# Patient Record
Sex: Male | Born: 1960
Health system: Southern US, Community
[De-identification: ages and names within clinical notes are randomized; demographics above are authoritative.]

## PROBLEM LIST (undated history)

## (undated) DIAGNOSIS — H16209 Unspecified keratoconjunctivitis, unspecified eye: Secondary | ICD-10-CM

## (undated) DIAGNOSIS — D869 Sarcoidosis, unspecified: Secondary | ICD-10-CM

## (undated) DIAGNOSIS — E785 Hyperlipidemia, unspecified: Secondary | ICD-10-CM

## (undated) DIAGNOSIS — K5790 Diverticulosis of intestine, part unspecified, without perforation or abscess without bleeding: Secondary | ICD-10-CM

## (undated) DIAGNOSIS — F419 Anxiety disorder, unspecified: Secondary | ICD-10-CM

## (undated) HISTORY — PX: POLYPECTOMY: SHX149

## (undated) HISTORY — DX: Unspecified keratoconjunctivitis, unspecified eye: H16.209

## (undated) HISTORY — DX: Diverticulosis of intestine, part unspecified, without perforation or abscess without bleeding: K57.90

## (undated) HISTORY — PX: WRIST FRACTURE SURGERY: SHX121

## (undated) HISTORY — DX: Hyperlipidemia, unspecified: E78.5

## (undated) HISTORY — DX: Sarcoidosis, unspecified: D86.9

## (undated) HISTORY — DX: Anxiety disorder, unspecified: F41.9

## (undated) HISTORY — PX: APPENDECTOMY: SHX54

## (undated) HISTORY — PX: INGUINAL HERNIA REPAIR: SUR1180

## (undated) HISTORY — PX: OTHER SURGICAL HISTORY: SHX169

---

## 1983-07-12 DIAGNOSIS — D869 Sarcoidosis, unspecified: Secondary | ICD-10-CM

## 1983-07-12 HISTORY — DX: Sarcoidosis, unspecified: D86.9

## 2004-06-17 ENCOUNTER — Ambulatory Visit: Payer: Self-pay | Admitting: Internal Medicine

## 2005-05-19 ENCOUNTER — Ambulatory Visit: Payer: Self-pay | Admitting: Internal Medicine

## 2006-07-10 ENCOUNTER — Ambulatory Visit: Payer: Self-pay | Admitting: Internal Medicine

## 2006-07-10 LAB — CONVERTED CEMR LAB
Albumin: 4.2 g/dL (ref 3.5–5.2)
Alkaline Phosphatase: 88 units/L (ref 39–117)
Basophils Absolute: 0 10*3/uL (ref 0.0–0.1)
CO2: 29 meq/L (ref 19–32)
Calcium: 9.1 mg/dL (ref 8.4–10.5)
Chol/HDL Ratio, serum: 3.9
Glomerular Filtration Rate, Af Am: 103 mL/min/{1.73_m2}
Glucose, Bld: 98 mg/dL (ref 70–99)
LDL DIRECT: 132.2 mg/dL
Lymphocytes Relative: 17.9 % (ref 12.0–46.0)
Monocytes Absolute: 0.4 10*3/uL (ref 0.2–0.7)
Monocytes Relative: 7.9 % (ref 3.0–11.0)
Platelets: 277 10*3/uL (ref 150–400)
Potassium: 3.8 meq/L (ref 3.5–5.1)
TSH: 3.6 microintl units/mL (ref 0.35–5.50)
Total Protein: 7 g/dL (ref 6.0–8.3)
Triglyceride fasting, serum: 68 mg/dL (ref 0–149)
VLDL: 14 mg/dL (ref 0–40)

## 2006-11-06 ENCOUNTER — Ambulatory Visit: Payer: Self-pay | Admitting: Internal Medicine

## 2006-11-06 LAB — CONVERTED CEMR LAB
ALT: 21 units/L (ref 0–40)
AST: 24 units/L (ref 0–37)
LDL Cholesterol: 98 mg/dL (ref 0–99)
Total CHOL/HDL Ratio: 3.5
VLDL: 14 mg/dL (ref 0–40)

## 2006-12-25 ENCOUNTER — Ambulatory Visit: Payer: Self-pay | Admitting: Internal Medicine

## 2006-12-25 LAB — CONVERTED CEMR LAB
Nitrite: NEGATIVE
Specific Gravity, Urine: 1.01
WBC Urine, dipstick: NEGATIVE

## 2007-05-24 ENCOUNTER — Ambulatory Visit: Payer: Self-pay | Admitting: Internal Medicine

## 2007-06-01 LAB — CONVERTED CEMR LAB
ALT: 24 units/L (ref 0–53)
VLDL: 9 mg/dL (ref 0–40)

## 2007-11-15 ENCOUNTER — Ambulatory Visit: Payer: Self-pay | Admitting: Internal Medicine

## 2007-11-18 LAB — CONVERTED CEMR LAB
AST: 26 units/L (ref 0–37)
Albumin: 3.9 g/dL (ref 3.5–5.2)
Alkaline Phosphatase: 81 units/L (ref 39–117)
Cholesterol: 154 mg/dL (ref 0–200)
Total CHOL/HDL Ratio: 3
Total Protein: 6.6 g/dL (ref 6.0–8.3)
Triglycerides: 49 mg/dL (ref 0–149)

## 2007-11-20 ENCOUNTER — Telehealth (INDEPENDENT_AMBULATORY_CARE_PROVIDER_SITE_OTHER): Payer: Self-pay | Admitting: *Deleted

## 2007-11-26 ENCOUNTER — Ambulatory Visit: Payer: Self-pay | Admitting: Internal Medicine

## 2007-11-26 DIAGNOSIS — D869 Sarcoidosis, unspecified: Secondary | ICD-10-CM | POA: Insufficient documentation

## 2009-01-23 ENCOUNTER — Ambulatory Visit: Payer: Self-pay | Admitting: Internal Medicine

## 2009-01-23 DIAGNOSIS — E785 Hyperlipidemia, unspecified: Secondary | ICD-10-CM | POA: Insufficient documentation

## 2009-01-26 ENCOUNTER — Encounter (INDEPENDENT_AMBULATORY_CARE_PROVIDER_SITE_OTHER): Payer: Self-pay | Admitting: *Deleted

## 2009-02-02 ENCOUNTER — Encounter (INDEPENDENT_AMBULATORY_CARE_PROVIDER_SITE_OTHER): Payer: Self-pay | Admitting: *Deleted

## 2009-02-12 ENCOUNTER — Ambulatory Visit: Payer: Self-pay | Admitting: Internal Medicine

## 2009-02-13 ENCOUNTER — Encounter (INDEPENDENT_AMBULATORY_CARE_PROVIDER_SITE_OTHER): Payer: Self-pay | Admitting: *Deleted

## 2009-02-13 ENCOUNTER — Ambulatory Visit: Payer: Self-pay | Admitting: Gastroenterology

## 2009-03-11 HISTORY — PX: COLONOSCOPY W/ POLYPECTOMY: SHX1380

## 2009-03-13 ENCOUNTER — Encounter: Payer: Self-pay | Admitting: Gastroenterology

## 2009-03-13 ENCOUNTER — Ambulatory Visit: Payer: Self-pay | Admitting: Gastroenterology

## 2009-03-18 ENCOUNTER — Ambulatory Visit: Payer: Self-pay | Admitting: Internal Medicine

## 2009-03-18 DIAGNOSIS — Z86010 Personal history of colonic polyps: Secondary | ICD-10-CM | POA: Insufficient documentation

## 2009-03-18 DIAGNOSIS — K5730 Diverticulosis of large intestine without perforation or abscess without bleeding: Secondary | ICD-10-CM | POA: Insufficient documentation

## 2009-03-18 LAB — CONVERTED CEMR LAB
Bilirubin Urine: NEGATIVE
Blood in Urine, dipstick: NEGATIVE
Glucose, Urine, Semiquant: NEGATIVE
Protein, U semiquant: NEGATIVE
Specific Gravity, Urine: 1.01
pH: 6.5

## 2009-03-19 ENCOUNTER — Encounter: Payer: Self-pay | Admitting: Gastroenterology

## 2009-09-10 ENCOUNTER — Ambulatory Visit: Payer: Self-pay | Admitting: Internal Medicine

## 2009-09-10 DIAGNOSIS — H9319 Tinnitus, unspecified ear: Secondary | ICD-10-CM | POA: Insufficient documentation

## 2010-02-02 ENCOUNTER — Ambulatory Visit: Payer: Self-pay | Admitting: Internal Medicine

## 2010-08-08 LAB — CONVERTED CEMR LAB
ALT: 30 units/L (ref 0–53)
AST: 25 units/L (ref 0–37)
AST: 31 units/L (ref 0–37)
Albumin: 4.3 g/dL (ref 3.5–5.2)
Alkaline Phosphatase: 77 units/L (ref 39–117)
Alkaline Phosphatase: 80 units/L (ref 39–117)
Basophils Absolute: 0 10*3/uL (ref 0.0–0.1)
Basophils Absolute: 0 10*3/uL (ref 0.0–0.1)
Bilirubin, Direct: 0.1 mg/dL (ref 0.0–0.3)
Calcium: 9.2 mg/dL (ref 8.4–10.5)
Calcium: 9.3 mg/dL (ref 8.4–10.5)
Creatinine, Ser: 0.9 mg/dL (ref 0.4–1.5)
Eosinophils Absolute: 0.1 10*3/uL (ref 0.0–0.7)
Eosinophils Relative: 0.8 % (ref 0.0–5.0)
GFR calc non Af Amer: 105.22 mL/min (ref >60)
GFR calc non Af Amer: 94.9 mL/min (ref >60)
Glucose, Bld: 100 mg/dL — ABNORMAL HIGH (ref 70–99)
Glucose, Bld: 105 mg/dL — ABNORMAL HIGH (ref 70–99)
HCT: 44.7 % (ref 39.0–52.0)
HDL: 47.4 mg/dL (ref >39.00)
HDL: 65.2 mg/dL (ref >39.00)
Hemoglobin: 15 g/dL (ref 13.0–17.0)
Hemoglobin: 15.4 g/dL (ref 13.0–17.0)
Hgb A1c MFr Bld: 4.9 % (ref 4.6–6.5)
LDL Cholesterol: 100 mg/dL — ABNORMAL HIGH (ref 0–99)
LDL Goal: 130 mg/dL
Lymphocytes Relative: 19.1 % (ref 12.0–46.0)
Lymphocytes Relative: 23.2 % (ref 12.0–46.0)
Monocytes Relative: 7.7 % (ref 3.0–12.0)
Monocytes Relative: 9.7 % (ref 3.0–12.0)
Neutro Abs: 2.6 10*3/uL (ref 1.4–7.7)
Neutro Abs: 3.7 10*3/uL (ref 1.4–7.7)
Neutrophils Relative %: 64.4 % (ref 43.0–77.0)
Platelets: 217 10*3/uL (ref 150.0–400.0)
Potassium: 4.5 meq/L (ref 3.5–5.1)
RBC: 4.64 M/uL (ref 4.22–5.81)
RDW: 11.7 % (ref 11.5–14.6)
RDW: 12.5 % (ref 11.5–14.6)
Sodium: 139 meq/L (ref 135–145)
Sodium: 142 meq/L (ref 135–145)
Total Bilirubin: 1.1 mg/dL (ref 0.3–1.2)
Total CHOL/HDL Ratio: 3
Triglycerides: 59 mg/dL (ref 0.0–149.0)
VLDL: 11.8 mg/dL (ref 0.0–40.0)
VLDL: 4.8 mg/dL (ref 0.0–40.0)
WBC: 5.1 10*3/uL (ref 4.5–10.5)

## 2010-08-10 NOTE — Assessment & Plan Note (Signed)
Summary: CPX/KDC   Vital Signs:  Patient profile:   50 year old male Height:      75.25 inches Weight:      184.2 pounds BMI:     22.95 Temp:     98.3 degrees F oral Pulse rate:   64 / minute Resp:     14 per minute BP sitting:   130 / 84  (left arm) Cuff size:   large  Vitals Entered By: Shonna Chock CMA (February 02, 2010 9:51 AM)  CC: Lipid Management   CC:  Lipid Management.  History of Present Illness: [name] is here for a physical; he is asymptomatic. He is engaged in  a very high level CVE / weight program  6X/ week for 4 months  with 16  # loss. He stopped statin 2 months  ago.  Lipid Management History:      Positive NCEP/ATP III risk factors include male age 26 years old or older.  Negative NCEP/ATP III risk factors include non-diabetic, no family history for ischemic heart disease, non-tobacco-user status, non-hypertensive, no ASHD (atherosclerotic heart disease), no prior stroke/TIA, no peripheral vascular disease, and no history of aortic aneurysm.     Current Medications (verified): 1)  Mega Multi Men  Cr-Tabs (Multiple Vitamins-Minerals) .... Daily  Allergies (verified): No Known Drug Allergies  Past History:  Past Medical History: Keratoconjunctivitis sicca, PMH of  Hyperlipidemia: NMR 2005: 170(1705/1193), HDL 39, TG 85. LDL goal = < 130. Framingham Study LDL goal = < 160. Sarcoidosis, PMH of  1985 Colonic polyps, Diverticulosis   03/13/2009, Dr  Wendall Papa  Past Surgical History: Trans Bronchial Biopsy  in mid 1980s:  Sarcoidosis, S/P  oral  steroids > 1 year Appendectomy fractured left wrist, S/P surgery Inguinal herniorrhaphy bilaterally lumpectomy neck  @ age 12 Colon polypectomy 03/2009  Family History: mgf: MI in 69s mother: murmur, CAD ,4 vessel CBAG paternal grandfather :prostate cancer father: Alzheimers,  colon polyps brother: died HIV; 2 sons have had non MRSA  Staph skin  infections   Social History: Former Smoker: quit @  age  71 Alcohol use-yes 18 beers/week Occupation:Supervisor  Review of Systems  The patient denies anorexia, fever, vision loss, decreased hearing, hoarseness, chest pain, syncope, dyspnea on exertion, peripheral edema, prolonged cough, headaches, hemoptysis, abdominal pain, melena, hematochezia, severe indigestion/heartburn, hematuria, suspicious skin lesions, depression, unusual weight change, abnormal bleeding, enlarged lymph nodes, and angioedema.    Physical Exam  General:  Thin but well-developed,well-nourished; alert,appropriate and cooperative throughout examination Head:  Normocephalic and atraumatic without obvious abnormalities. No apparent alopecia  Eyes:  No corneal or conjunctival inflammation noted. Perrla. Funduscopic exam benign, without hemorrhages, exudates or papilledema.  Ears:  External ear exam shows no significant lesions or deformities.  Otoscopic examination reveals clear canals, tympanic membranes are intact bilaterally without bulging, retraction, inflammation or discharge. Hearing is grossly normal bilaterally. Nose:  External nasal examination shows no deformity or inflammation. Nasal mucosa are pink and moist without lesions or exudates. Slight septal deviation Mouth:  Oral mucosa and oropharynx without lesions or exudates.  Teeth in good repair. Neck:  No deformities, masses, or tenderness noted. Lungs:  Normal respiratory effort, chest expands symmetrically. Lungs are clear to auscultation, no crackles or wheezes. Heart:  Normal rate and regular rhythm. S1 and S2 normal without gallop, murmur, click, rub or other extra sounds. Abdomen:  Bowel sounds positive,abdomen soft and non-tender without masses, organomegaly or hernias noted. Rectal:  No external abnormalities noted. Normal sphincter tone.  No rectal masses or tenderness. Genitalia:  Testes bilaterally descended without nodularity, tenderness or masses. No scrotal masses or lesions. No penis lesions or urethral  discharge. Prostate:  Prostate gland firm and smooth,  ULN but no enlargement, nodularity, tenderness, mass, asymmetry or induration. Msk:  No deformity or scoliosis noted of thoracic or lumbar spine.   Pulses:  R and L carotid,radial,dorsalis pedis and posterior tibial pulses are full and equal bilaterally Extremities:  No clubbing, cyanosis, edema, or deformity noted with normal full range of motion of all joints.   Neurologic:  alert & oriented X3 and DTRs symmetrical and normal.   Skin:  Intact without suspicious lesions or rashes. Tanned Cervical Nodes:  No lymphadenopathy noted Axillary Nodes:  No palpable lymphadenopathy Inguinal Nodes:  No significant adenopathy Psych:  memory intact for recent and remote, normally interactive, and good eye contact.     Impression & Recommendations:  Problem # 1:  ROUTINE GENERAL MEDICAL EXAM@HEALTH  CARE FACL (ICD-V70.0)  Orders: EKG w/ Interpretation (93000) Venipuncture (04540) TLB-Lipid Panel (80061-LIPID) TLB-BMP (Basic Metabolic Panel-BMET) (80048-METABOL) TLB-CBC Platelet - w/Differential (85025-CBCD) TLB-Hepatic/Liver Function Pnl (80076-HEPATIC) TLB-TSH (Thyroid Stimulating Hormone) (84443-TSH) TLB-PSA (Prostate Specific Antigen) (84153-PSA)  Problem # 2:  HYPERLIPIDEMIA (ICD-272.4) on TLC  The following medications were removed from the medication list:    Pravachol 40 Mg Tabs (Pravastatin sodium) .Marland Kitchen... 1 by mouth qd  Problem # 3:  COLONIC POLYPS, HX OF (ICD-V12.72) as per Dr Christella Hartigan  Problem # 4:  FAMILY HISTORY OF MALIGNANT NEOPLASM PROSTATE (ICD-V16.42)  Orders: Venipuncture (98119)  Complete Medication List: 1)  Mega Multi Men Cr-tabs (Multiple vitamins-minerals) .... Daily  Lipid Assessment/Plan:      Based on NCEP/ATP III, the patient's risk factor category is "0-1 risk factors".  The patient's lipid goals are as follows: Total cholesterol goal is 200; LDL cholesterol goal is 130; HDL cholesterol goal is 40;  Triglyceride goal is 150.  His LDL cholesterol goal has been met.    Patient Instructions: 1)  Take an  81 mg coated Aspirin every day.  Appended Document: CPX/KDC

## 2010-08-10 NOTE — Assessment & Plan Note (Signed)
Summary: LEFT EAR PAIN & RINGING/RH........Marland Kitchen   Vital Signs:  Patient profile:   50 year old male Weight:      198.2 pounds Temp:     98.3 degrees F oral Pulse rate:   72 / minute Resp:     14 per minute BP sitting:   100 / 66  (left arm) Cuff size:   large  Vitals Entered By: Shonna Chock (September 10, 2009 12:23 PM) CC: Left ear concerns x 3-4 weeks Comments REVIEWED MED LIST, PATIENT AGREED DOSE AND INSTRUCTION CORRECT    CC:  Left ear concerns x 3-4 weeks.  History of Present Illness: Sharp pain R ear which woke him X2  three weeks ago. That resolved over 24 hrs. Tinnitus L ear 1 week later . No PMH of ear disease. Dental extraction L mandible last week. Rx: none  Allergies (verified): No Known Drug Allergies  Review of Systems General:  Denies chills, fever, sweats, and weight loss. ENT:  Denies decreased hearing, ear discharge, nasal congestion, sinus pressure, and sore throat; No frontal headache, facial pain or purulence. Neuro:  Denies disturbances in coordination, headaches, poor balance, and sensation of room spinning.  Physical Exam  General:  well-nourished,in no acute distress; alert,appropriate and cooperative throughout examination Eyes:  No corneal or conjunctival inflammation noted. EOMI. Perrla. No nystagmus. Ears:  External ear exam shows no significant lesions or deformities.  Otoscopic examination reveals clear canals, tympanic membranes are intact bilaterally without bulging, retraction, inflammation or discharge. Hearing is grossly normal bilaterally. Whisper heard @ 6 ftTuning fork exam normal Nose:  External nasal examination shows no deformity or inflammation. Nasal mucosa are pink and moist without lesions or exudates. Mouth:  Oral mucosa and oropharynx without lesions or exudates.  Teeth in good repair. Neurologic:  alert & oriented X3, finger-to-nose normal, and Romberg negative.   Cervical Nodes:  No lymphadenopathy noted Axillary Nodes:  No palpable  lymphadenopathy   Impression & Recommendations:  Problem # 1:  TINNITUS, LEFT (ICD-388.30)  Complete Medication List: 1)  Pravachol 40 Mg Tabs (Pravastatin sodium) .Marland Kitchen.. 1 by mouth qd  Patient Instructions: 1)  White noise @ night . Go to Web MD  re: tinnitus

## 2010-09-16 ENCOUNTER — Encounter: Payer: Self-pay | Admitting: Internal Medicine

## 2011-04-09 IMAGING — CR DG CHEST 2V
2 series · 2 of 2 positions shown · non-contrast
Comparison: Chest x-ray of 07/02/2001

CLINICAL DATA: Sarcoidosis, wellness exam

CHEST - 2 VIEW

[view not recorded (1 of 2)]
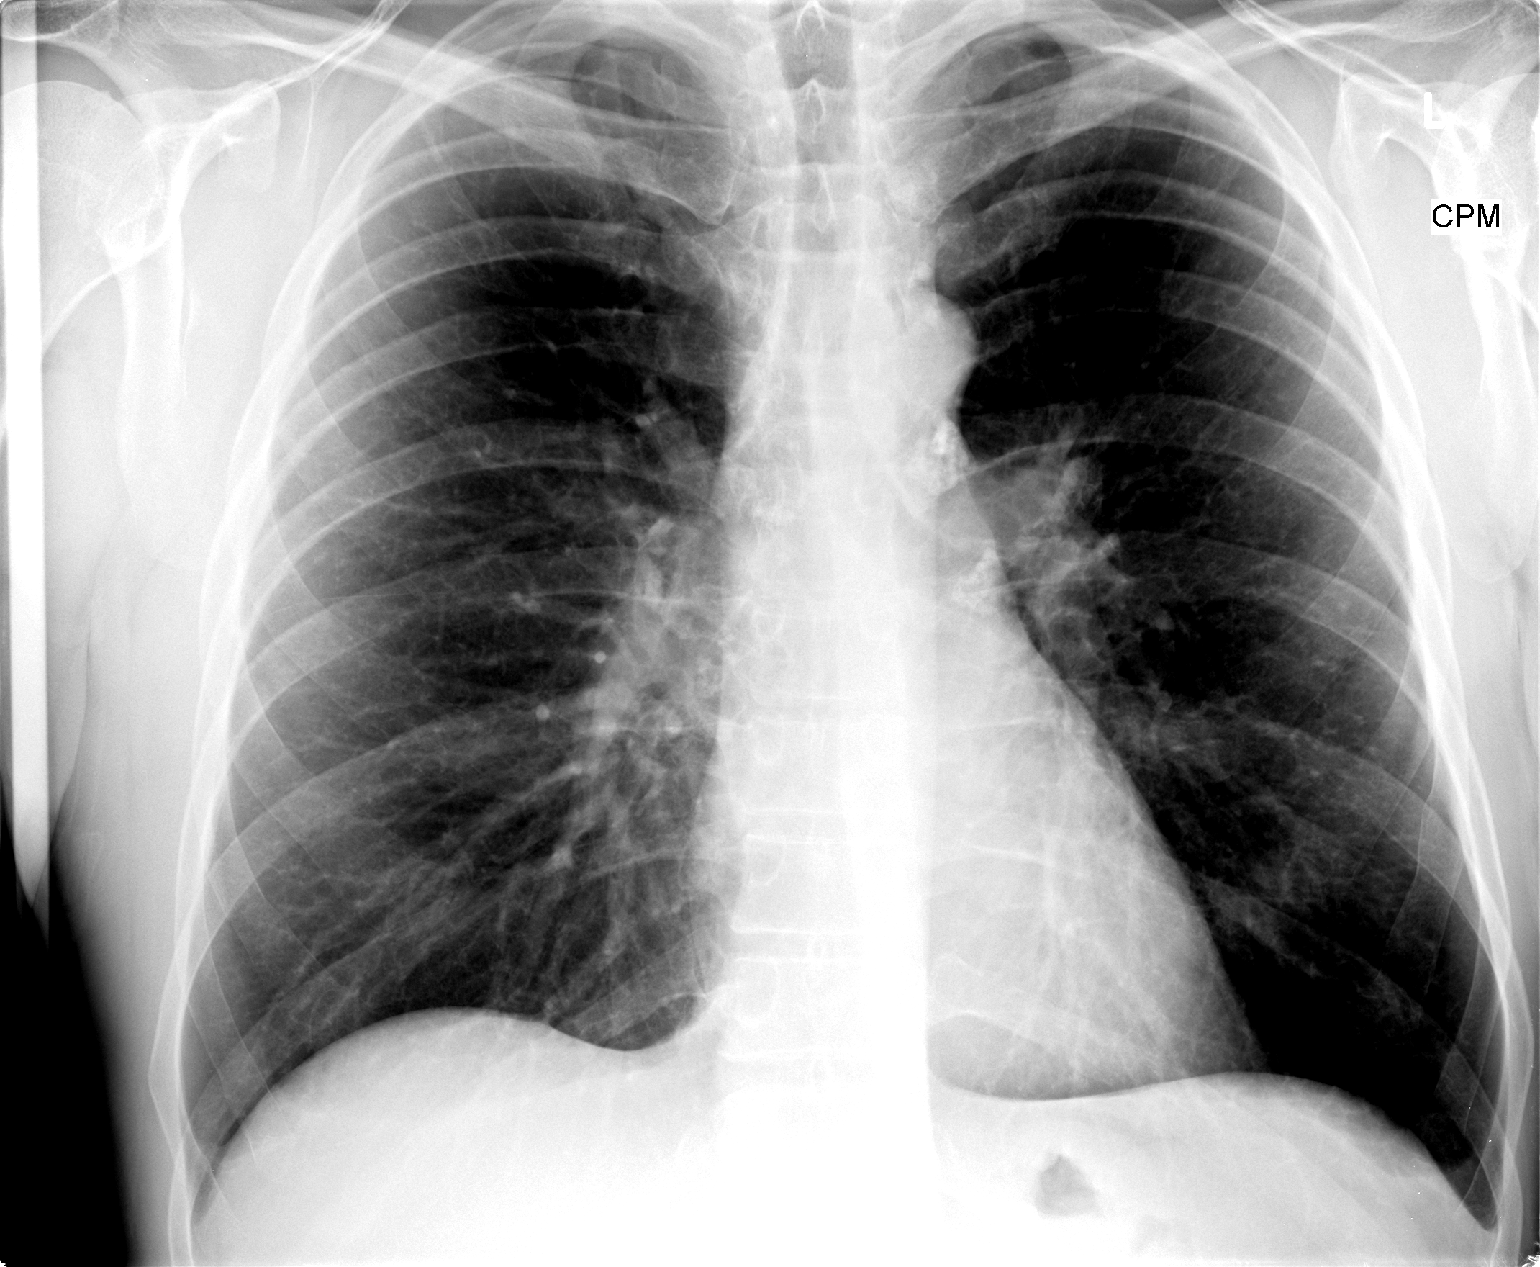

[view not recorded (2 of 2)]
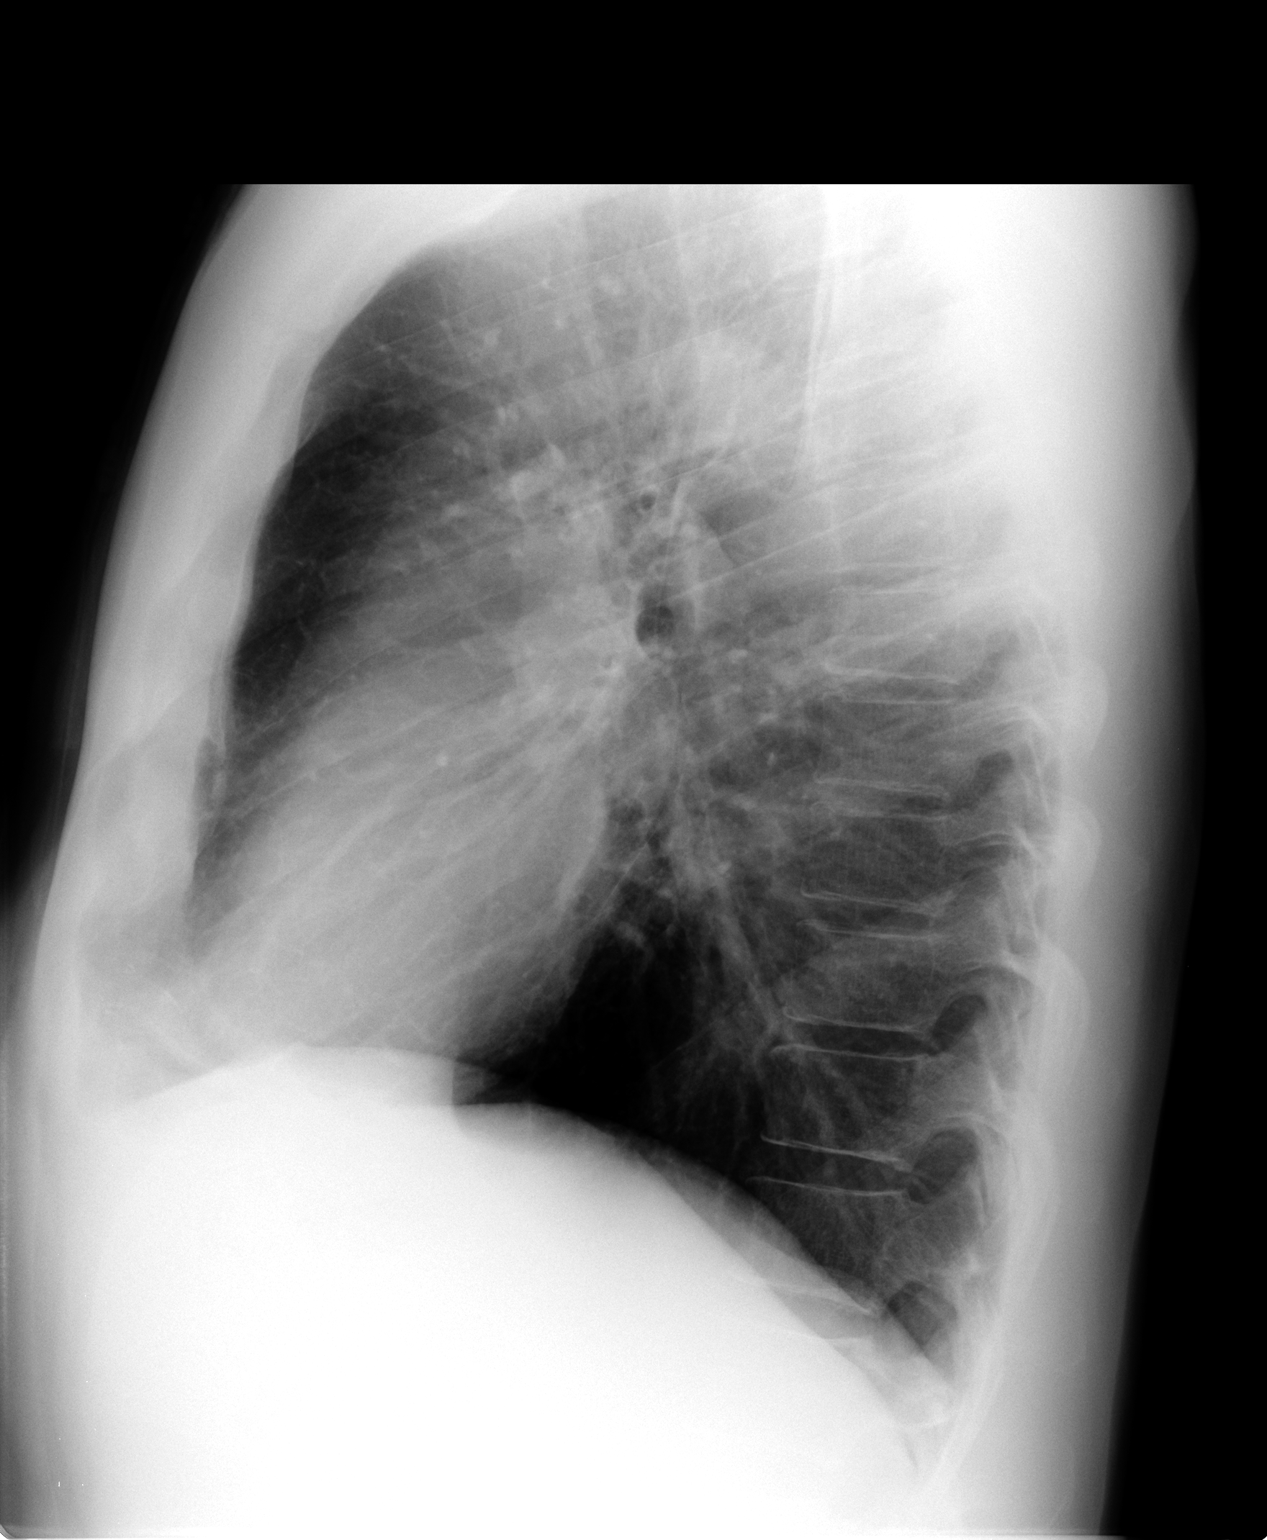

[2 of 2 positions shown; findings below may reference images not displayed]

FINDINGS: The lungs remain clear and hyperaerated.  Peribronchial
thickening is noted consistent with bronchitis.  Calcified
mediastinal nodes remain consistent with prior granulomatous
disease.  The heart is within normal limits in size.  No bony
abnormality is seen.
IMPRESSION: No change in hyperaeration and probable chronic bronchitis.  No
active lung disease.

## 2011-04-11 ENCOUNTER — Encounter: Payer: Self-pay | Admitting: Internal Medicine

## 2011-04-11 ENCOUNTER — Ambulatory Visit (INDEPENDENT_AMBULATORY_CARE_PROVIDER_SITE_OTHER): Payer: 59 | Admitting: Internal Medicine

## 2011-04-11 VITALS — BP 107/90 | HR 62 | Temp 97.7°F | Resp 14 | Ht 75.0 in | Wt 185.0 lb

## 2011-04-11 DIAGNOSIS — Z0000 Encounter for general adult medical examination without abnormal findings: Secondary | ICD-10-CM

## 2011-04-11 DIAGNOSIS — Z86010 Personal history of colonic polyps: Secondary | ICD-10-CM

## 2011-04-11 DIAGNOSIS — E785 Hyperlipidemia, unspecified: Secondary | ICD-10-CM

## 2011-04-11 LAB — CBC WITH DIFFERENTIAL/PLATELET
Eosinophils Relative: 2.6 % (ref 0.0–5.0)
HCT: 44.4 % (ref 39.0–52.0)
Lymphs Abs: 1.3 10*3/uL (ref 0.7–4.0)
MCHC: 33.9 g/dL (ref 30.0–36.0)
MCV: 95.8 fl (ref 78.0–100.0)
Monocytes Absolute: 0.4 10*3/uL (ref 0.1–1.0)
Platelets: 263 10*3/uL (ref 150.0–400.0)
RDW: 12.7 % (ref 11.5–14.6)
WBC: 4.1 10*3/uL — ABNORMAL LOW (ref 4.5–10.5)

## 2011-04-11 NOTE — Patient Instructions (Signed)
Preventive Health Care: Exercise at least 30-45 minutes a day,  3-4 days a week.  Eat a low-fat diet with lots of fruits and vegetables, up to 7-9 servings per day. Consume less than 40 grams of sugar per day from foods & drinks with High Fructose Corn Sugar as # 1,2,3 or # 4 on label. Health Care Power of Attorney & Living Will. Complete if not in place ; these place you in charge of your health care decisions. 

## 2011-04-11 NOTE — Progress Notes (Signed)
Subjective:    Patient ID: Aaron Cherry, male    DOB: 03-13-1961, 50 y.o.   MRN: [id number]  HPI  Mr. Perkins  is here for a physical;acute issues include chronic R shoulder pain.      Review of Systems SHOULDER PAIN: Location: R   Onset: 2/12   Severity: up to 8 Pain is described as: variable , sharp to throbbing; constant pain  Worse with: lifting overhead    Better with: NSAIDS  Pain radiates to: not now; initially ito neck & jaw   Impaired range of motion: no  History of repetitive motion:  yes, lifting   History of trauma:  No other trauma   Past history of similar problem:  yes, 20 years ago post fall  Symptoms Back Pain:  no  Numbness/tingling:  Not now ; initially @ base of neck Weakness:  no  Red Flags Fever:  no  Bowel/bladder dysfunction:  no       Objective:   Physical Exam Gen.: Healthy and well-nourished in appearance. Alert, appropriate and cooperative throughout exam. Head: Normocephalic without obvious abnormalities;  no alopecia  Eyes: No corneal or conjunctival inflammation noted. Pupils equal round reactive to light and accommodation. Fundal exam is benign without hemorrhages, exudate, papilledema. Extraocular motion intact. Vision grossly normal. Ears: External  ear exam reveals no significant lesions or deformities. Canals clear .TMs normal. Hearing is grossly normal bilaterally; minimally decreased to whisper on L. Nose: External nasal exam reveals no deformity or inflammation. Nasal mucosa are pink and moist. No lesions or exudates noted.   Mouth: Oral mucosa and oropharynx reveal no lesions or exudates. Teeth in good repair. Neck: No deformities, masses, or tenderness noted. Range of motion &. Thyroid  normal. Lungs: Normal respiratory effort; chest expands symmetrically. Lungs are clear to auscultation without rales, wheezes, or increased work of breathing. Heart: Normal rate and rhythm. Normal S1 and S2. No gallop, click, or rub. S4 w/o   murmur. Abdomen: Bowel sounds normal; abdomen soft and nontender. No masses, organomegaly or hernias noted. Genitalia/ DRE: Varicoele on L; R prostate 1.5 + enlargement  .                                                                                   Musculoskeletal/extremities: No deformity or scoliosis noted of  the thoracic or lumbar spine. No clubbing, cyanosis, edema, or deformity noted. Range of motion  Normal, but slight pain with throwing motion .Tone & strength  normal.Joints normal. Nail health  good. Vascular: Carotid, radial artery, dorsalis pedis and  posterior tibial pulses are full and equal. No bruits present. Neurologic: Alert and oriented x3. Deep tendon reflexes symmetrical and normal.          Skin: Intact without suspicious lesions or rashes. Lymph: No cervical, axillary, or inguinal lymphadenopathy present. Psych: Mood and affect are normal. Normally interactive  Assessment & Plan:  #1 comprehensive physical exam; no acute findings #2 see Problem List with Assessments & Recommendations #3 R shoulder syndrome Plan: see Orders   Note: EKG is normal. Minor early repolarization changes are present which are stable and of no clinical significance.

## 2011-04-12 LAB — BASIC METABOLIC PANEL
BUN: 15 mg/dL (ref 6–23)
CO2: 26 mEq/L (ref 19–32)
Chloride: 105 mEq/L (ref 96–112)
Glucose, Bld: 93 mg/dL (ref 70–99)
Potassium: 4.1 mEq/L (ref 3.5–5.1)

## 2011-04-12 LAB — HEPATIC FUNCTION PANEL
ALT: 29 U/L (ref 0–53)
Bilirubin, Direct: 0.1 mg/dL (ref 0.0–0.3)
Total Bilirubin: 1.1 mg/dL (ref 0.3–1.2)

## 2011-04-12 LAB — LIPID PANEL
Cholesterol: 194 mg/dL (ref 0–200)
LDL Cholesterol: 113 mg/dL — ABNORMAL HIGH (ref 0–99)

## 2012-05-03 ENCOUNTER — Encounter: Payer: Self-pay | Admitting: Internal Medicine

## 2012-05-03 ENCOUNTER — Ambulatory Visit (INDEPENDENT_AMBULATORY_CARE_PROVIDER_SITE_OTHER): Payer: 59 | Admitting: Internal Medicine

## 2012-05-03 VITALS — BP 124/88 | HR 61 | Temp 97.7°F | Resp 14 | Ht 75.0 in | Wt 192.0 lb

## 2012-05-03 DIAGNOSIS — Z0000 Encounter for general adult medical examination without abnormal findings: Secondary | ICD-10-CM

## 2012-05-03 LAB — CBC WITH DIFFERENTIAL/PLATELET
Basophils Absolute: 0 10*3/uL (ref 0.0–0.1)
Eosinophils Absolute: 0.1 10*3/uL (ref 0.0–0.7)
HCT: 44.7 % (ref 39.0–52.0)
Hemoglobin: 15.2 g/dL (ref 13.0–17.0)
Lymphs Abs: 1.1 10*3/uL (ref 0.7–4.0)
MCHC: 33.9 g/dL (ref 30.0–36.0)
MCV: 95.3 fl (ref 78.0–100.0)
Monocytes Absolute: 0.4 10*3/uL (ref 0.1–1.0)
Neutro Abs: 3.2 10*3/uL (ref 1.4–7.7)
RDW: 12.6 % (ref 11.5–14.6)

## 2012-05-03 NOTE — Patient Instructions (Addendum)
Preventive Health Care: Exercise at least 30-45 minutes a day,  3-4 days a week.  Eat a low-fat diet with lots of fruits and vegetables, up to 7-9 servings per day.  Consume less than 40 grams of sugar per day from foods & drinks with High Fructose Corn Sugar as #1,2,3 or # 4 on label. Alcohol If you drink, do it moderately,less than 9 drinks per week, preferably less than 6 @ most. Health Care Power of Attorney & Living Will. Complete if not in place ; these place you in charge of your health care decisions. If you activate My Chart; the results can be released to you as soon as they populate from the lab. If you choose not to use this program; the labs have to be reviewed, copied & mailed   causing a delay in getting the results to you.

## 2012-05-03 NOTE — Progress Notes (Signed)
  Subjective:    Patient ID: Aaron Cherry, male    DOB: 1960-08-12, 51 y.o.   MRN: [id number]  HPI  Aaron Cherry is here for a physical; acute issues include intermittent nocturia, up to 4 times / night.      Review of Systems  The nocturia is related to the volume of fluid intake at night. If he does not drink fluids; he will wake up with a dry mouth. He has a history of keratoconjunctivitis sicca; but he relates the dry mouth to snoring.  He denies polydipsia, polyphagia, or polyuria otherwise.     Objective:   Physical Exam Gen.: Healthy and well-nourished in appearance. Alert, appropriate and cooperative throughout exam.Appears younger than stated age  Head: Normocephalic without obvious abnormalities;  no alopecia  Eyes: No corneal or conjunctival inflammation noted. Pupils equal round reactive to light and accommodation. Fundal exam is benign without hemorrhages, exudate, papilledema. Extraocular motion intact. Vision grossly normal. Ears: External  ear exam reveals no significant lesions or deformities. Canals clear .TMs normal. Hearing is grossly normal bilaterally. Nose: External nasal exam reveals no deformity or inflammation. Nasal mucosa are pink and moist. No lesions or exudates noted.  Mouth: Oral mucosa and oropharynx reveal no lesions or exudates. Teeth in good repair. Neck: No deformities, masses, or tenderness noted. Range of motion & Thyroid normal. Lungs: Normal respiratory effort; chest expands symmetrically. Lungs are clear to auscultation without rales, wheezes, or increased work of breathing. Chest: asymmetry of clavicles Heart: Normal rate and rhythm. Normal S1 and S2. No gallop, click, or rub. S4 w/o murmur. Abdomen: Bowel sounds normal; abdomen soft and nontender. No masses, organomegaly or hernias noted. Genitalia /DRE: Genitalia normal except for left varices. Prostate is upper limits of normal without  asymmetry, nodularity, or induration.                                        Musculoskeletal/extremities: No deformity or scoliosis noted of  the thoracic or lumbar spine. No clubbing, cyanosis, edema, or deformity noted. Range of motion  normal .Tone & strength  normal.Joints normal. Nail health  good. Vascular: Carotid, radial artery, dorsalis pedis and  posterior tibial pulses are full and equal. No bruits present. Neurologic: Alert and oriented x3. Deep tendon reflexes symmetrical and normal.          Skin: Intact without suspicious lesions or rashes. Lymph: No cervical, axillary, or inguinal lymphadenopathy present. Psych: Mood and affect are normal. Normally interactive                                                                                         Assessment & Plan:  #1 comprehensive physical exam; no acute findings  Plan: see Orders

## 2012-05-04 LAB — LIPID PANEL: VLDL: 8 mg/dL (ref 0.0–40.0)

## 2012-05-04 LAB — BASIC METABOLIC PANEL
CO2: 26 mEq/L (ref 19–32)
Calcium: 9.2 mg/dL (ref 8.4–10.5)
Glucose, Bld: 88 mg/dL (ref 70–99)
Potassium: 3.9 mEq/L (ref 3.5–5.1)
Sodium: 139 mEq/L (ref 135–145)

## 2012-05-04 LAB — HEPATIC FUNCTION PANEL
ALT: 23 U/L (ref 0–53)
AST: 29 U/L (ref 0–37)
Albumin: 3.9 g/dL (ref 3.5–5.2)
Alkaline Phosphatase: 69 U/L (ref 39–117)
Total Protein: 7 g/dL (ref 6.0–8.3)

## 2012-05-04 LAB — TSH: TSH: 2.49 u[IU]/mL (ref 0.35–5.50)

## 2012-08-25 ENCOUNTER — Other Ambulatory Visit: Payer: Self-pay

## 2013-05-07 ENCOUNTER — Telehealth: Payer: Self-pay

## 2013-05-07 NOTE — Telephone Encounter (Signed)
Medication List and allergies: reviewed  90 day supply/mail order: na Local prescriptions: [facility]  Immunizations due: will get flu vaccine at work on 05/09/2013  A/P:   No Changes to FH or PSH CCS--03/2009--q 5 years---polyps adenomatous--Dr Christella Hartigan PSA--04/2011---WNL  To Discuss with Provider: Not at this time

## 2013-05-09 ENCOUNTER — Ambulatory Visit (INDEPENDENT_AMBULATORY_CARE_PROVIDER_SITE_OTHER): Payer: 59 | Admitting: Internal Medicine

## 2013-05-09 ENCOUNTER — Encounter: Payer: Self-pay | Admitting: Internal Medicine

## 2013-05-09 VITALS — BP 128/69 | HR 67 | Temp 98.1°F | Resp 11 | Ht 74.5 in | Wt 192.6 lb

## 2013-05-09 DIAGNOSIS — Z0000 Encounter for general adult medical examination without abnormal findings: Secondary | ICD-10-CM

## 2013-05-09 LAB — CBC WITH DIFFERENTIAL/PLATELET
Basophils Relative: 0.8 % (ref 0.0–3.0)
HCT: 45.4 % (ref 39.0–52.0)
Hemoglobin: 15.4 g/dL (ref 13.0–17.0)
Lymphocytes Relative: 29.2 % (ref 12.0–46.0)
MCHC: 34 g/dL (ref 30.0–36.0)
Monocytes Relative: 8.3 % (ref 3.0–12.0)
Neutro Abs: 2.4 10*3/uL (ref 1.4–7.7)
RBC: 4.79 Mil/uL (ref 4.22–5.81)

## 2013-05-09 LAB — HEPATIC FUNCTION PANEL
AST: 25 U/L (ref 0–37)
Albumin: 4.2 g/dL (ref 3.5–5.2)
Alkaline Phosphatase: 72 U/L (ref 39–117)
Total Protein: 6.8 g/dL (ref 6.0–8.3)

## 2013-05-09 LAB — BASIC METABOLIC PANEL
CO2: 27 mEq/L (ref 19–32)
Calcium: 9.3 mg/dL (ref 8.4–10.5)
Potassium: 4 mEq/L (ref 3.5–5.1)
Sodium: 137 mEq/L (ref 135–145)

## 2013-05-09 LAB — TSH: TSH: 1.86 u[IU]/mL (ref 0.35–5.50)

## 2013-05-09 NOTE — Progress Notes (Signed)
  Subjective:    Patient ID: Aaron Cherry, male    DOB: January 10, 1961, 52 y.o.   MRN: [id number]  HPI  He is here for a physical;acute issues include nocturia 2-3 times a night depending on volume of fluids after the evening meal. Otherwise he denies polyuria, polydipsia, or polyphasia.     Review of Systems A heart healthy diet is followed; exercise encompasses  45 minutes 6  times per week as  CVE without symptoms. Specifically denied are  chest pain, palpitations, dyspnea, or claudication.  Family history is negative for premature coronary disease. Advanced cholesterol testing reveals  LDL goal is less than 100 ; ideally < 70  . No statin for 3 years; [employer] employed.     Objective:   Physical Exam  Gen.: Healthy and well-nourished in appearance. Alert, appropriate and cooperative throughout exam.Appears younger than stated age  Head: Normocephalic without obvious abnormalities; no alopecia  Eyes: No corneal or conjunctival inflammation noted. Pupils equal round reactive to light and accommodation.  Extraocular motion intact. Ears: External  ear exam reveals no significant lesions or deformities. Canals clear .TMs normal. Hearing is grossly normal . Nose: External nasal exam reveals no deformity or inflammation. Nasal mucosa are pink and moist. No lesions or exudates noted. Septum to R Mouth: Oral mucosa and oropharynx reveal no lesions or exudates. Teeth in good repair. Neck: No deformities, masses, or tenderness noted. Range of motion & Thyroid normal. Lungs: Normal respiratory effort; chest expands symmetrically. Lungs are clear to auscultation without rales, wheezes, or increased work of breathing. Heart: Normal rate and rhythm. Normal S1 and S2. No gallop, click, or rub. S4 w/o murmur. Abdomen: Bowel sounds normal; abdomen soft and nontender. No masses, organomegaly or hernias noted. Genitalia: Genitalia normal except for left varices & granuloma. Prostate is normal without enlargement,  asymmetry, nodularity, or induration.                                Musculoskeletal/extremities: No deformity or scoliosis noted of  the thoracic or lumbar spine.  No clubbing, cyanosis, edema, or significant extremity  deformity noted. Range of motion normal .Tone & strength  Normal. Joints normal. Nail health good. Able to lie down & sit up w/o help. Negative SLR bilaterally Vascular: Carotid, radial artery, dorsalis pedis and  posterior tibial pulses are full and equal. No bruits present. Neurologic: Alert and oriented x3. Deep tendon reflexes symmetrical and normal.         Skin: Intact without suspicious lesions or rashes. Lymph: No cervical, axillary, or inguinal lymphadenopathy present. Psych: Mood and affect are normal. Normally interactive                                                                                        Assessment & Plan:  #1 comprehensive physical exam; no acute findings  Plan: see Orders  & Recommendations

## 2013-05-09 NOTE — Patient Instructions (Signed)
Your next office appointment will be determined based upon review of your pending labs. Those instructions will be transmitted to you through My Chart . 

## 2013-05-11 ENCOUNTER — Encounter: Payer: Self-pay | Admitting: Internal Medicine

## 2013-05-16 ENCOUNTER — Other Ambulatory Visit: Payer: Self-pay

## 2013-07-11 HISTORY — PX: COLONOSCOPY: SHX174

## 2014-02-07 ENCOUNTER — Encounter: Payer: Self-pay | Admitting: Gastroenterology

## 2014-02-24 ENCOUNTER — Encounter: Payer: Self-pay | Admitting: Gastroenterology

## 2014-03-26 ENCOUNTER — Encounter: Payer: Self-pay | Admitting: Gastroenterology

## 2014-04-03 ENCOUNTER — Telehealth: Payer: Self-pay | Admitting: *Deleted

## 2014-04-03 NOTE — Telephone Encounter (Signed)
Patient called to verify his colonoscopy, scheduled for 04-29-2014 at 9:00 Am.  He wanted to mention he was diagnosed 25 years ago with Sarcoidosis and has not had any new problems with that in years. I advised that will not interfere with his procedure at this time.

## 2014-04-15 ENCOUNTER — Ambulatory Visit (AMBULATORY_SURGERY_CENTER): Payer: Self-pay | Admitting: *Deleted

## 2014-04-15 VITALS — Ht 74.0 in | Wt 192.0 lb

## 2014-04-15 DIAGNOSIS — Z86010 Personal history of colonic polyps: Secondary | ICD-10-CM

## 2014-04-15 MED ORDER — MOVIPREP 100 G PO SOLR: 1.0000 | Freq: Once | ORAL | Status: DC

## 2014-04-15 NOTE — Progress Notes (Signed)
Pt states there is a question whether paternal GF had colon vs prostate cancer. ewm   No egg or soy allergy. ewm  No home 02 use. ewm  No problems with past sedation. ewm

## 2014-04-29 ENCOUNTER — Encounter: Payer: Self-pay | Admitting: Gastroenterology

## 2014-04-29 ENCOUNTER — Ambulatory Visit (AMBULATORY_SURGERY_CENTER): Payer: 59 | Admitting: Gastroenterology

## 2014-04-29 VITALS — BP 110/75 | HR 60 | Temp 96.9°F | Resp 18 | Ht 74.0 in | Wt 192.0 lb

## 2014-04-29 DIAGNOSIS — K5730 Diverticulosis of large intestine without perforation or abscess without bleeding: Secondary | ICD-10-CM

## 2014-04-29 DIAGNOSIS — Z86010 Personal history of colonic polyps: Secondary | ICD-10-CM

## 2014-04-29 MED ORDER — SODIUM CHLORIDE 0.9 % IV SOLN: 500.0000 mL | INTRAVENOUS | Status: DC

## 2014-04-29 NOTE — Patient Instructions (Signed)

## 2014-04-29 NOTE — Op Note (Signed)
[facility] [address] [place], [id number]   COLONOSCOPY PROCEDURE REPORT  PATIENT: Aaron Cherry, Aaron Cherry  MR#: [id number] BIRTHDATE: 1960/12/26 , 53  yrs. old GENDER: male ENDOSCOPIST: Rachael Feeaniel P Sheyanne Munley, MD PROCEDURE DATE:  04/29/2014 PROCEDURE:   Colonoscopy, surveillance First Screening Colonoscopy - Avg.  risk and is 50 yrs.  old or older - No.  Prior Negative Screening - Now for repeat screening. N/A  History of Adenoma - Now for follow-up colonoscopy & has been > or = to 3 yrs.  Yes hx of adenoma.  Has been 3 or more years since last colonoscopy.  Polyps Removed Today? No.  Recommend repeat exam, <10 yrs? No. ASA CLASS:   Class II INDICATIONS:subcentimeter adenom removed 2010 Colonoscopy Dr. Christella HartiganJacobs. MEDICATIONS: Monitored anesthesia care and Propofol 370 mg IV  DESCRIPTION OF PROCEDURE:   After the risks benefits and alternatives of the procedure were thoroughly explained, informed consent was obtained.  The digital rectal exam revealed no abnormalities of the rectum.   The LB CF-H180AL Loaner [id number] endoscope was introduced through the anus and advanced to the cecum, which was identified by both the appendix and ileocecal valve. No adverse events experienced.   The quality of the prep was excellent.  The instrument was then slowly withdrawn as the colon was fully examined.  COLON FINDINGS: There was moderate diverticulosis noted in the left colon with associated colonic narrowing and muscular hypertrophy. The examination was otherwise normal.  Retroflexed views revealed no abnormalities. The time to cecum=5 minutes 26 seconds. Withdrawal time=8 minutes 54 seconds.  The scope was withdrawn and the procedure completed. COMPLICATIONS: There were no immediate complications.  ENDOSCOPIC IMPRESSION: 1.   There was moderate diverticulosis noted in the left colon 2.   The examination was otherwise normal (no polyps or cancers)  RECOMMENDATIONS: You should  continue to follow colorectal cancer screening guidelines for "routine risk" patients with a repeat colonoscopy in 10 years.  eSigned:  Rachael Feeaniel P Zephaniah Enyeart, MD 04/29/2014 9:15 AM   cc: Marga MelnickWilliam Hopper, MD

## 2014-04-29 NOTE — Progress Notes (Signed)
Procedure ends, to recovery, report given and VSS. 

## 2014-04-30 ENCOUNTER — Telehealth: Payer: Self-pay | Admitting: *Deleted

## 2014-04-30 NOTE — Telephone Encounter (Signed)
  Follow up Call-  Call back number 04/29/2014  Post procedure Call Back phone  # [phone]  Permission to leave phone message Yes     Patient questions:  Do you have a fever, pain , or abdominal swelling? No. Pain Score  0 *  Have you tolerated food without any problems? Yes.    Have you been able to return to your normal activities? Yes.    Do you have any questions about your discharge instructions: Diet   No. Medications  No. Follow up visit  No.  Do you have questions or concerns about your Care? No.  Actions: * If pain score is 4 or above: No action needed, pain <4.

## 2014-05-13 ENCOUNTER — Ambulatory Visit (INDEPENDENT_AMBULATORY_CARE_PROVIDER_SITE_OTHER): Payer: 59 | Admitting: Internal Medicine

## 2014-05-13 ENCOUNTER — Other Ambulatory Visit (INDEPENDENT_AMBULATORY_CARE_PROVIDER_SITE_OTHER): Payer: 59

## 2014-05-13 ENCOUNTER — Encounter: Payer: Self-pay | Admitting: Internal Medicine

## 2014-05-13 ENCOUNTER — Other Ambulatory Visit: Payer: Self-pay | Admitting: Internal Medicine

## 2014-05-13 VITALS — BP 94/74 | HR 61 | Temp 98.1°F | Resp 12 | Ht 74.0 in | Wt 191.4 lb

## 2014-05-13 DIAGNOSIS — Z0000 Encounter for general adult medical examination without abnormal findings: Secondary | ICD-10-CM

## 2014-05-13 DIAGNOSIS — Z0189 Encounter for other specified special examinations: Secondary | ICD-10-CM

## 2014-05-13 DIAGNOSIS — E785 Hyperlipidemia, unspecified: Secondary | ICD-10-CM

## 2014-05-13 DIAGNOSIS — Z86010 Personal history of colonic polyps: Secondary | ICD-10-CM

## 2014-05-13 LAB — CBC WITH DIFFERENTIAL/PLATELET
BASOS PCT: 0.6 % (ref 0.0–3.0)
Basophils Absolute: 0 10*3/uL (ref 0.0–0.1)
EOS PCT: 1.9 % (ref 0.0–5.0)
Eosinophils Absolute: 0.1 10*3/uL (ref 0.0–0.7)
HEMATOCRIT: 47.6 % (ref 39.0–52.0)
Hemoglobin: 16.2 g/dL (ref 13.0–17.0)
Lymphocytes Relative: 28.5 % (ref 12.0–46.0)
Lymphs Abs: 1.4 10*3/uL (ref 0.7–4.0)
MCHC: 34.1 g/dL (ref 30.0–36.0)
MCV: 93.7 fl (ref 78.0–100.0)
MONO ABS: 0.4 10*3/uL (ref 0.1–1.0)
Monocytes Relative: 8.2 % (ref 3.0–12.0)
Neutro Abs: 3 10*3/uL (ref 1.4–7.7)
Neutrophils Relative %: 60.8 % (ref 43.0–77.0)
Platelets: 287 10*3/uL (ref 150.0–400.0)
RBC: 5.08 Mil/uL (ref 4.22–5.81)
RDW: 12.7 % (ref 11.5–15.5)
WBC: 5 10*3/uL (ref 4.0–10.5)

## 2014-05-13 LAB — HEPATIC FUNCTION PANEL
ALT: 21 U/L (ref 0–53)
AST: 29 U/L (ref 0–37)
Albumin: 3.8 g/dL (ref 3.5–5.2)
Alkaline Phosphatase: 70 U/L (ref 39–117)
BILIRUBIN DIRECT: 0.2 mg/dL (ref 0.0–0.3)
TOTAL PROTEIN: 7 g/dL (ref 6.0–8.3)
Total Bilirubin: 1.1 mg/dL (ref 0.2–1.2)

## 2014-05-13 LAB — BASIC METABOLIC PANEL
BUN: 14 mg/dL (ref 6–23)
CHLORIDE: 106 meq/L (ref 96–112)
CO2: 23 mEq/L (ref 19–32)
Calcium: 9.8 mg/dL (ref 8.4–10.5)
Creatinine, Ser: 1 mg/dL (ref 0.4–1.5)
GFR: 80.47 mL/min (ref >60.00)
Glucose, Bld: 102 mg/dL — ABNORMAL HIGH (ref 70–99)
POTASSIUM: 5.3 meq/L — AB (ref 3.5–5.1)
SODIUM: 142 meq/L (ref 135–145)

## 2014-05-13 LAB — URINALYSIS
BILIRUBIN URINE: NEGATIVE
Hgb urine dipstick: NEGATIVE
Ketones, ur: NEGATIVE
Leukocytes, UA: NEGATIVE
Nitrite: NEGATIVE
Specific Gravity, Urine: 1.01 (ref 1.000–1.030)
TOTAL PROTEIN, URINE-UPE24: NEGATIVE
URINE GLUCOSE: NEGATIVE
Urobilinogen, UA: 0.2 (ref 0.0–1.0)
pH: 7.5 (ref 5.0–8.0)

## 2014-05-13 LAB — TSH: TSH: 2.3 u[IU]/mL (ref 0.35–4.50)

## 2014-05-13 NOTE — Patient Instructions (Signed)
Your next office appointment will be determined based upon review of your pending labs . Those instructions will be transmitted to you through My Chart.   The influenza vaccine is recommended as the influenza virus is becoming more virulent with increased risk to all ages, not just the elderly and very young. Over 200 people died of the flu in [place]  in 2014.

## 2014-05-13 NOTE — Progress Notes (Signed)
   Subjective:    Patient ID: Aaron DownsMichael E Behler, male    DOB: 1961-05-27, 53 y.o.   MRN: [id number]  HPI  He is here for a physical;acute issues include urinary frequency.  Other than occasional incomplete voiding nocturnally; he has no genitourinary urinary symptoms.  He is on heart healthy diet. He exercises as weights 3 times a week for 75 minutes and running 3 times a week for 30 minutes without cardiopulmonary symptoms.  Based on prior advanced lipid testing his LDL goal is less than 135. There is no premature coronary disease in his family. He did take a statin for 4 years but this was discontinued in 2010 as therapy wife changes had controlled his LDL.  He had a colonoscopy recently which was negative; in 2010 he had had tubular adenomas      Review of Systems  Dysuria, pyuria, hematuria, nocturia or polyuria are denied.  Chest pain, palpitations, tachycardia, exertional dyspnea, paroxysmal nocturnal dyspnea, claudication or edema are absent.       Objective:   Physical Exam Gen.: Healthy and well-nourished in appearance. Alert, appropriate and cooperative throughout exam. Appears younger than stated age  Head: Normocephalic without obvious abnormalities; no alopecia  Eyes: No corneal or conjunctival inflammation noted. Pupils equal round reactive to light and accommodation. Extraocular motion intact. Some ptosis Ears: External  ear exam reveals no significant lesions or deformities. Canals clear .TMs normal. Hearing is grossly normal bilaterally. Nose: External nasal exam reveals no deformity or inflammation. Nasal mucosa are pink and moist. No lesions or exudates noted. Septum to R Mouth: Oral mucosa and oropharynx reveal no lesions or exudates. Teeth in good repair. Neck: No deformities, masses, or tenderness noted. Range of motion  & Thyroid normal Lungs: Normal respiratory effort; chest expands symmetrically. Lungs are clear to auscultation without rales, wheezes, or  increased work of breathing. Heart: Normal rate and rhythm. Normal S1 and S2. No gallop, click, or rub. No  murmur. Abdomen: Bowel sounds normal; abdomen soft and nontender. No masses, organomegaly or hernias noted. Genitalia: Genitalia normal except for left varices. Prostate exam deferred (S/P colonoscopy) Musculoskeletal/extremities: No deformity or scoliosis noted of  the thoracic or lumbar spine.  No clubbing, cyanosis, edema, or significant extremity  deformity noted. Range of motion normal .Tone & strength normal. Hand joints normal. Minor knee crepitus.  Fingernail  health good. Able to lie down & sit up w/o help. Negative SLR bilaterally Vascular: Carotid, radial artery, dorsalis pedis and  posterior tibial pulses are full and equal. No bruits present. Neurologic: Alert and oriented x3. Deep tendon reflexes symmetrical and normal.  Gait normal .      Skin: Intact without suspicious lesions or rashes. Lymph: No cervical, axillary, or inguinal  lymphadenopathy present. Psych: Mood and affect are normal. Normally interactive                                                                                        Assessment & Plan:  #1 comprehensive physical exam; no acute findings  Plan: see Orders  & Recommendations

## 2014-05-13 NOTE — Progress Notes (Signed)
Pre visit review using our clinic review tool, if applicable. No additional management support is needed unless otherwise documented below in the visit note. 

## 2014-05-14 ENCOUNTER — Other Ambulatory Visit (INDEPENDENT_AMBULATORY_CARE_PROVIDER_SITE_OTHER): Payer: 59

## 2014-05-14 ENCOUNTER — Telehealth: Payer: Self-pay

## 2014-05-14 DIAGNOSIS — R739 Hyperglycemia, unspecified: Secondary | ICD-10-CM

## 2014-05-14 LAB — HEMOGLOBIN A1C: Hgb A1c MFr Bld: 4.9 % (ref 4.6–6.5)

## 2014-05-14 NOTE — Telephone Encounter (Signed)
Request for lab add on has been faxed

## 2014-05-14 NOTE — Telephone Encounter (Signed)
-----   Message from Pecola LawlessWilliam F Hopper, MD sent at 05/14/2014  7:13 AM EST ----- Please add A1c (R73.9)

## 2014-05-15 LAB — NMR LIPOPROFILE WITH LIPIDS
Cholesterol, Total: 213 mg/dL — ABNORMAL HIGH (ref 100–199)
HDL PARTICLE NUMBER: 38 umol/L (ref >=30.5)
HDL Size: 9.8 nm (ref >=9.2)
HDL-C: 81 mg/dL (ref >39)
LARGE HDL: 13.5 umol/L (ref >=4.8)
LARGE VLDL-P: 1.4 nmol/L (ref <=2.7)
LDL (calc): 115 mg/dL — ABNORMAL HIGH (ref 0–99)
LDL Particle Number: 1215 nmol/L — ABNORMAL HIGH (ref <1000)
LDL Size: 21.1 nm (ref >=20.8)
LP-IR Score: <25 (ref <=45)
Small LDL Particle Number: 220 nmol/L (ref <=527)
TRIGLYCERIDES: 83 mg/dL (ref 0–149)
VLDL Size: 48.6 nm — ABNORMAL HIGH (ref <=46.6)

## 2014-10-02 ENCOUNTER — Ambulatory Visit (INDEPENDENT_AMBULATORY_CARE_PROVIDER_SITE_OTHER): Payer: 59 | Admitting: Internal Medicine

## 2014-10-02 ENCOUNTER — Encounter: Payer: Self-pay | Admitting: Internal Medicine

## 2014-10-02 DIAGNOSIS — R1031 Right lower quadrant pain: Secondary | ICD-10-CM | POA: Diagnosis not present

## 2014-10-02 DIAGNOSIS — R361 Hematospermia: Secondary | ICD-10-CM
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
## 2014-10-02 NOTE — Patient Instructions (Signed)
The Urology* referral will be scheduled and you'll be notified of the time.Please call the Referral Co-Ordinator @ [phone] if you have not been notified of appointment time within 7-10 days. 

## 2014-10-02 NOTE — Progress Notes (Signed)
Pre visit review using our clinic review tool, if applicable. No additional management support is needed unless otherwise documented below in the visit note. 

## 2015-06-15 ENCOUNTER — Encounter: Payer: Self-pay | Admitting: Internal Medicine

## 2015-06-15 ENCOUNTER — Ambulatory Visit (INDEPENDENT_AMBULATORY_CARE_PROVIDER_SITE_OTHER): Payer: 59 | Admitting: Internal Medicine

## 2015-06-15 ENCOUNTER — Encounter: Payer: 59 | Admitting: Internal Medicine

## 2015-06-15 ENCOUNTER — Other Ambulatory Visit (INDEPENDENT_AMBULATORY_CARE_PROVIDER_SITE_OTHER): Payer: 59

## 2015-06-15 VITALS — BP 110/68 | HR 71 | Temp 98.5°F | Resp 16 | Wt 195.0 lb

## 2015-06-15 DIAGNOSIS — Z0000 Encounter for general adult medical examination without abnormal findings: Secondary | ICD-10-CM

## 2015-06-15 LAB — COMPREHENSIVE METABOLIC PANEL
ALK PHOS: 83 U/L (ref 39–117)
ALT: 19 U/L (ref 0–53)
AST: 22 U/L (ref 0–37)
Albumin: 4.2 g/dL (ref 3.5–5.2)
BILIRUBIN TOTAL: 0.7 mg/dL (ref 0.2–1.2)
BUN: 13 mg/dL (ref 6–23)
CALCIUM: 9.3 mg/dL (ref 8.4–10.5)
CO2: 28 mEq/L (ref 19–32)
Chloride: 107 mEq/L (ref 96–112)
Creatinine, Ser: 0.87 mg/dL (ref 0.40–1.50)
GFR: 96.31 mL/min (ref >60.00)
Glucose, Bld: 112 mg/dL — ABNORMAL HIGH (ref 70–99)
Potassium: 4.5 mEq/L (ref 3.5–5.1)
Sodium: 142 mEq/L (ref 135–145)
TOTAL PROTEIN: 6.8 g/dL (ref 6.0–8.3)

## 2015-06-15 LAB — CBC WITH DIFFERENTIAL/PLATELET
BASOS ABS: 0 10*3/uL (ref 0.0–0.1)
Basophils Relative: 0.7 % (ref 0.0–3.0)
EOS PCT: 1.8 % (ref 0.0–5.0)
Eosinophils Absolute: 0.1 10*3/uL (ref 0.0–0.7)
HEMATOCRIT: 48 % (ref 39.0–52.0)
Hemoglobin: 16.1 g/dL (ref 13.0–17.0)
LYMPHS PCT: 24.5 % (ref 12.0–46.0)
Lymphs Abs: 1.1 10*3/uL (ref 0.7–4.0)
MCHC: 33.5 g/dL (ref 30.0–36.0)
MCV: 94.2 fl (ref 78.0–100.0)
MONOS PCT: 6.8 % (ref 3.0–12.0)
Monocytes Absolute: 0.3 10*3/uL (ref 0.1–1.0)
NEUTROS ABS: 3 10*3/uL (ref 1.4–7.7)
Neutrophils Relative %: 66.2 % (ref 43.0–77.0)
Platelets: 282 10*3/uL (ref 150.0–400.0)
RBC: 5.09 Mil/uL (ref 4.22–5.81)
RDW: 12.2 % (ref 11.5–15.5)
WBC: 4.5 10*3/uL (ref 4.0–10.5)

## 2015-06-15 LAB — LIPID PANEL
CHOLESTEROL: 170 mg/dL (ref 0–200)
HDL: 60.2 mg/dL (ref >39.00)
LDL Cholesterol: 101 mg/dL — ABNORMAL HIGH (ref 0–99)
NonHDL: 109.84
TRIGLYCERIDES: 44 mg/dL (ref 0.0–149.0)
Total CHOL/HDL Ratio: 3
VLDL: 8.8 mg/dL (ref 0.0–40.0)

## 2015-06-15 LAB — TSH: TSH: 1.73 u[IU]/mL (ref 0.35–4.50)

## 2015-06-15 NOTE — Patient Instructions (Signed)
We have reviewed your prior records including labs and tests today.  Test(s) ordered today. Your results will be released to MyChart (or called to you) after review, usually within 72hours after test completion. If any changes need to be made, you will be notified at that same time.  All other Health Maintenance issues reviewed.   All recommended immunizations and age-appropriate screenings are up-to-date.  No immunizations administered today.   Health Maintenance, Male A healthy lifestyle and preventative care can promote health and wellness.  Maintain regular health, dental, and eye exams.  Eat a healthy diet. Foods like vegetables, fruits, whole grains, low-fat dairy products, and lean protein foods contain the nutrients you need and are low in calories. Decrease your intake of foods high in solid fats, added sugars, and salt. Get information about a proper diet from your health care provider, if necessary.  Regular physical exercise is one of the most important things you can do for your health. Most adults should get at least 150 minutes of moderate-intensity exercise (any activity that increases your heart rate and causes you to sweat) each week. In addition, most adults need muscle-strengthening exercises on 2 or more days a week.   Maintain a healthy weight. The body mass index (BMI) is a screening tool to identify possible weight problems. It provides an estimate of body fat based on height and weight. Your health care provider can find your BMI and can help you achieve or maintain a healthy weight. For males 20 years and older:  A BMI below 18.5 is considered underweight.  A BMI of 18.5 to 24.9 is normal.  A BMI of 25 to 29.9 is considered overweight.  A BMI of 30 and above is considered obese.  Maintain normal blood lipids and cholesterol by exercising and minimizing your intake of saturated fat. Eat a balanced diet with plenty of fruits and vegetables. Blood tests for lipids  and cholesterol should begin at age 70 and be repeated every 5 years. If your lipid or cholesterol levels are high, you are over age 74, or you are at high risk for heart disease, you may need your cholesterol levels checked more frequently.Ongoing high lipid and cholesterol levels should be treated with medicines if diet and exercise are not working.  If you smoke, find out from your health care provider how to quit. If you do not use tobacco, do not start.  Lung cancer screening is recommended for adults aged 55-80 years who are at high risk for developing lung cancer because of a history of smoking. A yearly low-dose CT scan of the lungs is recommended for people who have at least a 30-pack-year history of smoking and are current smokers or have quit within the past 15 years. A pack year of smoking is smoking an average of 1 pack of cigarettes a day for 1 year (for example, a 30-pack-year history of smoking could mean smoking 1 pack a day for 30 years or 2 packs a day for 15 years). Yearly screening should continue until the smoker has stopped smoking for at least 15 years. Yearly screening should be stopped for people who develop a health problem that would prevent them from having lung cancer treatment.  If you choose to drink alcohol, do not have more than 2 drinks per day. One drink is considered to be 12 oz (360 mL) of beer, 5 oz (150 mL) of wine, or 1.5 oz (45 mL) of liquor.  Avoid the use of street drugs.  Do not share needles with anyone. Ask for help if you need support or instructions about stopping the use of drugs.  High blood pressure causes heart disease and increases the risk of stroke. High blood pressure is more likely to develop in:  People who have blood pressure in the end of the normal range (100-139/85-89 mm Hg).  People who are overweight or obese.  People who are African American.  If you are 69-82 years of age, have your blood pressure checked every 3-5 years. If you are  35 years of age or older, have your blood pressure checked every year. You should have your blood pressure measured twice--once when you are at a hospital or clinic, and once when you are not at a hospital or clinic. Record the average of the two measurements. To check your blood pressure when you are not at a hospital or clinic, you can use:  An automated blood pressure machine at a pharmacy.  A home blood pressure monitor.  If you are 39-62 years old, ask your health care provider if you should take aspirin to prevent heart disease.  Diabetes screening involves taking a blood sample to check your fasting blood sugar level. This should be done once every 3 years after age 64 if you are at a normal weight and without risk factors for diabetes. Testing should be considered at a younger age or be carried out more frequently if you are overweight and have at least 1 risk factor for diabetes.  Colorectal cancer can be detected and often prevented. Most routine colorectal cancer screening begins at the age of 8 and continues through age 61. However, your health care provider may recommend screening at an earlier age if you have risk factors for colon cancer. On a yearly basis, your health care provider may provide home test kits to check for hidden blood in the stool. A small camera at the end of a tube may be used to directly examine the colon (sigmoidoscopy or colonoscopy) to detect the earliest forms of colorectal cancer. Talk to your health care provider about this at age 17 when routine screening begins. A direct exam of the colon should be repeated every 5-10 years through age 107, unless early forms of precancerous polyps or small growths are found.  People who are at an increased risk for hepatitis B should be screened for this virus. You are considered at high risk for hepatitis B if:  You were born in a country where hepatitis B occurs often. Talk with your health care provider about which  countries are considered high risk.  Your parents were born in a high-risk country and you have not received a shot to protect against hepatitis B (hepatitis B vaccine).  You have HIV or AIDS.  You use needles to inject street drugs.  You live with, or have sex with, someone who has hepatitis B.  You are a man who has sex with other men (MSM).  You get hemodialysis treatment.  You take certain medicines for conditions like cancer, organ transplantation, and autoimmune conditions.  Hepatitis C blood testing is recommended for all people born from [birth year] through 1965 and any individual with known risk factors for hepatitis C.  Healthy men should no longer receive prostate-specific antigen (PSA) blood tests as part of routine cancer screening. Talk to your health care provider about prostate cancer screening.  Testicular cancer screening is not recommended for adolescents or adult males who have no symptoms. Screening includes self-exam,  a health care provider exam, and other screening tests. Consult with your health care provider about any symptoms you have or any concerns you have about testicular cancer.  Practice safe sex. Use condoms and avoid high-risk sexual practices to reduce the spread of sexually transmitted infections (STIs).  You should be screened for STIs, including gonorrhea and chlamydia if:  You are sexually active and are younger than 24 years.  You are older than 24 years, and your health care provider tells you that you are at risk for this type of infection.  Your sexual activity has changed since you were last screened, and you are at an increased risk for chlamydia or gonorrhea. Ask your health care provider if you are at risk.  If you are at risk of being infected with HIV, it is recommended that you take a prescription medicine daily to prevent HIV infection. This is called pre-exposure prophylaxis (PrEP). You are considered at risk if:  You are a man who has  sex with other men (MSM).  You are a heterosexual man who is sexually active with multiple partners.  You take drugs by injection.  You are sexually active with a partner who has HIV.  Talk with your health care provider about whether you are at high risk of being infected with HIV. If you choose to begin PrEP, you should first be tested for HIV. You should then be tested every 3 months for as long as you are taking PrEP.  Use sunscreen. Apply sunscreen liberally and repeatedly throughout the day. You should seek shade when your shadow is shorter than you. Protect yourself by wearing long sleeves, pants, a wide-brimmed hat, and sunglasses year round whenever you are outdoors.  Tell your health care provider of new moles or changes in moles, especially if there is a change in shape or color. Also, tell your health care provider if a mole is larger than the size of a pencil eraser.  A one-time screening for abdominal aortic aneurysm (AAA) and surgical repair of large AAAs by ultrasound is recommended for men aged 65-75 years who are current or former smokers.  Stay current with your vaccines (immunizations).   This information is not intended to replace advice given to you by your health care provider. Make sure you discuss any questions you have with your health care provider.   Document Released: 12/24/2007 Document Revised: 07/18/2014 Document Reviewed: 11/22/2010 Elsevier Interactive Patient Education [employer]2016 [employer] Inc.

## 2015-06-15 NOTE — Progress Notes (Signed)
Subjective:    Patient ID: Aaron Cherry, male    DOB: 1961-06-10, 54 y.o.   MRN: [id number]  HPI He is here to establish with a new pcp and for a physical exam.  He has no concerns  Medications and allergies reviewed with patient and updated if appropriate.  Patient Active Problem List   Diagnosis Date Noted  . DIVERTICULOSIS, COLON 03/18/2009  . History of colonic polyps 03/18/2009  . Hyperlipidemia 01/23/2009  . Sarcoidosis (HCC) 11/26/2007    Current Outpatient Prescriptions on File Prior to Visit  Medication Sig Dispense Refill  . Multiple Vitamins-Minerals (MEGA MULTI MEN PO) Take by mouth daily.       No current facility-administered medications on file prior to visit.    Past Medical History  Diagnosis Date  . Keratoconjunctivitis  sicca   . Hyperlipidemia   . Diverticulosis   . Sarcoidosis 1985    PMH of    Past Surgical History  Procedure Laterality Date  . Trans bronchial biopsy      M[facility] 80's; sarcoidosis  . Appendectomy    . Wrist fracture surgery    . Inguinal hernia repair      Bilateral   . Colonoscopy w/ polypectomy  03/2009    [facility]; due 2015  . Neck lumpectomy  age 83    benign  . Polypectomy    . Colonoscopy  2015    negative    Social History   Social History  . Marital Status: Single    Spouse Name: N/A  . Number of Children: N/A  . Years of Education: N/A   Social History Main Topics  . Smoking status: Former Smoker    Quit date: 07/11/1988  . Smokeless tobacco: Never Used     Comment: smoked 1976- 1990 , up to 1 ppWEEK  . Alcohol Use: Yes     Comment: 18 beers/week  . Drug Use: No  . Sexual Activity: Not on file   Other Topics Concern  . Not on file   Social History Narrative    Review of Systems  Constitutional: Negative for fever, chills, appetite change, fatigue and unexpected weight change.  HENT: Negative for hearing loss.   Eyes: Negative for visual disturbance.  Respiratory: Negative for cough,  shortness of breath and wheezing.   Cardiovascular: Negative for chest pain, palpitations and leg swelling.  Gastrointestinal: Negative for nausea, abdominal pain, diarrhea, constipation and blood in stool.       No GERD  Genitourinary: Positive for difficulty urinating (frequent night time and some difficulty initiating urination at night only - usually occurs after drinking a few beers.  saw urology last year - exam normal ). Negative for dysuria and hematuria.  Musculoskeletal: Negative for myalgias, back pain and arthralgias.  Skin: Negative for rash.  Neurological: Negative for dizziness, weakness, light-headedness, numbness and headaches.  Psychiatric/Behavioral: Negative for dysphoric mood. The patient is not nervous/anxious.        Objective:   Filed Vitals:   06/15/15 0809  BP: 110/68  Pulse: 71  Temp: 98.5 F (36.9 C)  Resp: 16   Filed Weights   06/15/15 0809  Weight: 195 lb (88.451 kg)   Body mass index is 25.03 kg/(m^2).   Physical Exam  Constitutional: He appears well-developed and well-nourished. No distress.  HENT:  Head: Normocephalic and atraumatic.  Right Ear: External ear normal.  Left Ear: External ear normal.  Mouth/Throat: Oropharynx is clear and moist.  B/l ear canals  and TM normal  Eyes: Conjunctivae and EOM are normal.  Neck: Neck supple. No tracheal deviation present. No thyromegaly present.  No carotid bruit  Cardiovascular: Normal rate, regular rhythm and normal heart sounds.   No murmur heard. Pulmonary/Chest: Effort normal and breath sounds normal. No respiratory distress. He has no wheezes. He has no rales.  Abdominal: Soft. Bowel sounds are normal. He exhibits no distension. There is no tenderness.  Genitourinary:  deferred  Musculoskeletal: He exhibits no edema.  Lymphadenopathy:    He has no cervical adenopathy.  Skin: Skin is warm and dry. No rash noted. He is not diaphoretic.  Psychiatric: He has a normal mood and affect. His  behavior is normal.          Assessment & Plan:   Physical Exam: screening blood work ordered, consented to hiv, hep c and psa Colonoscopy up to date No recent eye exam - recommended scheduling one Last EKG reviewed, given his physical shape and regularly running will hold off an EKG today Deferred flu vaccine Td up to date Exercising regularly, weight is good Limiting beer consumption - no more than 3 at one time and does not drink nightly No smoking No skin issues No depression, anxiety   Overall, very healthy.  Would consider going on cholesterol med if needed -- he was on one in the past.  Likely will not need one.  ASCVD risk using cholesterol numbers from 2 years ago is very low.  Will recalculate with new cholesterol numbers.  Follow up annually for a PE

## 2015-06-15 NOTE — Progress Notes (Signed)
Pre visit review using our clinic review tool, if applicable. No additional management support is needed unless otherwise documented below in the visit note. 

## 2015-06-16 LAB — PSA, TOTAL AND FREE
PSA FREE PCT: 15 % — AB (ref >25)
PSA, Free: 0.18 ng/mL
PSA: 1.22 ng/mL (ref <=4.00)

## 2015-06-16 LAB — HEPATITIS C ANTIBODY: HCV Ab: NEGATIVE

## 2015-06-18 ENCOUNTER — Encounter: Payer: Self-pay | Admitting: Internal Medicine

## 2016-06-16 ENCOUNTER — Other Ambulatory Visit (INDEPENDENT_AMBULATORY_CARE_PROVIDER_SITE_OTHER): Payer: Commercial Managed Care - HMO

## 2016-06-16 ENCOUNTER — Encounter: Payer: Self-pay | Admitting: Internal Medicine

## 2016-06-16 ENCOUNTER — Ambulatory Visit (INDEPENDENT_AMBULATORY_CARE_PROVIDER_SITE_OTHER): Payer: Commercial Managed Care - HMO | Admitting: Internal Medicine

## 2016-06-16 VITALS — BP 116/76 | HR 70 | Temp 98.1°F | Resp 16 | Ht 74.0 in | Wt 191.0 lb

## 2016-06-16 DIAGNOSIS — Z0000 Encounter for general adult medical examination without abnormal findings: Secondary | ICD-10-CM

## 2016-06-16 DIAGNOSIS — D869 Sarcoidosis, unspecified: Secondary | ICD-10-CM | POA: Diagnosis not present

## 2016-06-16 DIAGNOSIS — R739 Hyperglycemia, unspecified: Secondary | ICD-10-CM | POA: Insufficient documentation

## 2016-06-16 LAB — LIPID PANEL
CHOL/HDL RATIO: 3
Cholesterol: 198 mg/dL (ref 0–200)
HDL: 76 mg/dL (ref >39.00)
LDL CALC: 112 mg/dL — AB (ref 0–99)
NonHDL: 122.03
TRIGLYCERIDES: 48 mg/dL (ref 0.0–149.0)
VLDL: 9.6 mg/dL (ref 0.0–40.0)

## 2016-06-16 LAB — CBC WITH DIFFERENTIAL/PLATELET
BASOS ABS: 0.1 10*3/uL (ref 0.0–0.1)
Basophils Relative: 1 % (ref 0.0–3.0)
EOS ABS: 0.1 10*3/uL (ref 0.0–0.7)
Eosinophils Relative: 2.3 % (ref 0.0–5.0)
HEMATOCRIT: 44.9 % (ref 39.0–52.0)
Hemoglobin: 15.8 g/dL (ref 13.0–17.0)
LYMPHS PCT: 28 % (ref 12.0–46.0)
Lymphs Abs: 1.4 10*3/uL (ref 0.7–4.0)
MCHC: 35.2 g/dL (ref 30.0–36.0)
MCV: 92.1 fl (ref 78.0–100.0)
MONOS PCT: 9.3 % (ref 3.0–12.0)
Monocytes Absolute: 0.5 10*3/uL (ref 0.1–1.0)
NEUTROS PCT: 59.4 % (ref 43.0–77.0)
Neutro Abs: 3 10*3/uL (ref 1.4–7.7)
Platelets: 301 10*3/uL (ref 150.0–400.0)
RBC: 4.88 Mil/uL (ref 4.22–5.81)
RDW: 12.8 % (ref 11.5–15.5)
WBC: 5.1 10*3/uL (ref 4.0–10.5)

## 2016-06-16 LAB — COMPREHENSIVE METABOLIC PANEL
ALK PHOS: 75 U/L (ref 39–117)
ALT: 24 U/L (ref 0–53)
AST: 30 U/L (ref 0–37)
Albumin: 4.3 g/dL (ref 3.5–5.2)
BILIRUBIN TOTAL: 1 mg/dL (ref 0.2–1.2)
BUN: 17 mg/dL (ref 6–23)
CALCIUM: 9.4 mg/dL (ref 8.4–10.5)
CO2: 27 mEq/L (ref 19–32)
CREATININE: 0.93 mg/dL (ref 0.40–1.50)
Chloride: 106 mEq/L (ref 96–112)
GFR: 88.85 mL/min (ref >60.00)
GLUCOSE: 104 mg/dL — AB (ref 70–99)
Potassium: 4.2 mEq/L (ref 3.5–5.1)
Sodium: 140 mEq/L (ref 135–145)
TOTAL PROTEIN: 6.9 g/dL (ref 6.0–8.3)

## 2016-06-16 LAB — HEMOGLOBIN A1C: Hgb A1c MFr Bld: 4.9 % (ref 4.6–6.5)

## 2016-06-16 LAB — TSH: TSH: 2.54 u[IU]/mL (ref 0.35–4.50)

## 2016-06-16 NOTE — Progress Notes (Signed)
Pre visit review using our clinic review tool, if applicable. No additional management support is needed unless otherwise documented below in the visit note. 

## 2016-06-16 NOTE — Assessment & Plan Note (Signed)
asymptomatic

## 2016-06-16 NOTE — Patient Instructions (Addendum)
Test(s) ordered today. Your results will be released to MyChart (or called to you) after review, usually within 72hours after test completion. If any changes need to be made, you will be notified at that same time.  All other Health Maintenance issues reviewed.   All recommended immunizations and age-appropriate screenings are up-to-date or discussed.  No immunizations administered today.   Medications reviewed and updated.  No changes recommended at this time.   Please followup in one year    Health Maintenance, Male A healthy lifestyle and preventative care can promote health and wellness.  Maintain regular health, dental, and eye exams.  Eat a healthy diet. Foods like vegetables, fruits, whole grains, low-fat dairy products, and lean protein foods contain the nutrients you need and are low in calories. Decrease your intake of foods high in solid fats, added sugars, and salt. Get information about a proper diet from your health care provider, if necessary.  Regular physical exercise is one of the most important things you can do for your health. Most adults should get at least 150 minutes of moderate-intensity exercise (any activity that increases your heart rate and causes you to sweat) each week. In addition, most adults need muscle-strengthening exercises on 2 or more days a week.   Maintain a healthy weight. The body mass index (BMI) is a screening tool to identify possible weight problems. It provides an estimate of body fat based on height and weight. Your health care provider can find your BMI and can help you achieve or maintain a healthy weight. For males 20 years and older:  A BMI below 18.5 is considered underweight.  A BMI of 18.5 to 24.9 is normal.  A BMI of 25 to 29.9 is considered overweight.  A BMI of 30 and above is considered obese.  Maintain normal blood lipids and cholesterol by exercising and minimizing your intake of saturated fat. Eat a balanced diet with  plenty of fruits and vegetables. Blood tests for lipids and cholesterol should begin at age 20 and be repeated every 5 years. If your lipid or cholesterol levels are high, you are over age 50, or you are at high risk for heart disease, you may need your cholesterol levels checked more frequently.Ongoing high lipid and cholesterol levels should be treated with medicines if diet and exercise are not working.  If you smoke, find out from your health care provider how to quit. If you do not use tobacco, do not start.  Lung cancer screening is recommended for adults aged 55-80 years who are at high risk for developing lung cancer because of a history of smoking. A yearly low-dose CT scan of the lungs is recommended for people who have at least a 30-pack-year history of smoking and are current smokers or have quit within the past 15 years. A pack year of smoking is smoking an average of 1 pack of cigarettes a day for 1 year (for example, a 30-pack-year history of smoking could mean smoking 1 pack a day for 30 years or 2 packs a day for 15 years). Yearly screening should continue until the smoker has stopped smoking for at least 15 years. Yearly screening should be stopped for people who develop a health problem that would prevent them from having lung cancer treatment.  If you choose to drink alcohol, do not have more than 2 drinks per day. One drink is considered to be 12 oz (360 mL) of beer, 5 oz (150 mL) of wine, or 1.5 oz (  45 mL) of liquor.  Avoid the use of street drugs. Do not share needles with anyone. Ask for help if you need support or instructions about stopping the use of drugs.  High blood pressure causes heart disease and increases the risk of stroke. High blood pressure is more likely to develop in:  People who have blood pressure in the end of the normal range (100-139/85-89 mm Hg).  People who are overweight or obese.  People who are African American.  If you are 18-39 years of age, have  your blood pressure checked every 3-5 years. If you are 40 years of age or older, have your blood pressure checked every year. You should have your blood pressure measured twice-once when you are at a hospital or clinic, and once when you are not at a hospital or clinic. Record the average of the two measurements. To check your blood pressure when you are not at a hospital or clinic, you can use:  An automated blood pressure machine at a pharmacy.  A home blood pressure monitor.  If you are 45-79 years old, ask your health care provider if you should take aspirin to prevent heart disease.  Diabetes screening involves taking a blood sample to check your fasting blood sugar level. This should be done once every 3 years after age 45 if you are at a normal weight and without risk factors for diabetes. Testing should be considered at a younger age or be carried out more frequently if you are overweight and have at least 1 risk factor for diabetes.  Colorectal cancer can be detected and often prevented. Most routine colorectal cancer screening begins at the age of 50 and continues through age 75. However, your health care provider may recommend screening at an earlier age if you have risk factors for colon cancer. On a yearly basis, your health care provider may provide home test kits to check for hidden blood in the stool. A small camera at the end of a tube may be used to directly examine the colon (sigmoidoscopy or colonoscopy) to detect the earliest forms of colorectal cancer. Talk to your health care provider about this at age 50 when routine screening begins. A direct exam of the colon should be repeated every 5-10 years through age 75, unless early forms of precancerous polyps or small growths are found.  People who are at an increased risk for hepatitis B should be screened for this virus. You are considered at high risk for hepatitis B if:  You were born in a country where hepatitis B occurs often.  Talk with your health care provider about which countries are considered high risk.  Your parents were born in a high-risk country and you have not received a shot to protect against hepatitis B (hepatitis B vaccine).  You have HIV or AIDS.  You use needles to inject street drugs.  You live with, or have sex with, someone who has hepatitis B.  You are a man who has sex with other men (MSM).  You get hemodialysis treatment.  You take certain medicines for conditions like cancer, organ transplantation, and autoimmune conditions.  Hepatitis C blood testing is recommended for all people born from 1945 through 1965 and any individual with known risk factors for hepatitis C.  Healthy men should no longer receive prostate-specific antigen (PSA) blood tests as part of routine cancer screening. Talk to your health care provider about prostate cancer screening.  Testicular cancer screening is not recommended for   adolescents or adult males who have no symptoms. Screening includes self-exam, a health care provider exam, and other screening tests. Consult with your health care provider about any symptoms you have or any concerns you have about testicular cancer.  Practice safe sex. Use condoms and avoid high-risk sexual practices to reduce the spread of sexually transmitted infections (STIs).  You should be screened for STIs, including gonorrhea and chlamydia if:  You are sexually active and are younger than 24 years.  You are older than 24 years, and your health care provider tells you that you are at risk for this type of infection.  Your sexual activity has changed since you were last screened, and you are at an increased risk for chlamydia or gonorrhea. Ask your health care provider if you are at risk.  If you are at risk of being infected with HIV, it is recommended that you take a prescription medicine daily to prevent HIV infection. This is called pre-exposure prophylaxis (PrEP). You are  considered at risk if:  You are a man who has sex with other men (MSM).  You are a heterosexual man who is sexually active with multiple partners.  You take drugs by injection.  You are sexually active with a partner who has HIV.  Talk with your health care provider about whether you are at high risk of being infected with HIV. If you choose to begin PrEP, you should first be tested for HIV. You should then be tested every 3 months for as long as you are taking PrEP.  Use sunscreen. Apply sunscreen liberally and repeatedly throughout the day. You should seek shade when your shadow is shorter than you. Protect yourself by wearing long sleeves, pants, a wide-brimmed hat, and sunglasses year round whenever you are outdoors.  Tell your health care provider of new moles or changes in moles, especially if there is a change in shape or color. Also, tell your health care provider if a mole is larger than the size of a pencil eraser.  A one-time screening for abdominal aortic aneurysm (AAA) and surgical repair of large AAAs by ultrasound is recommended for men aged 65-75 years who are current or former smokers.  Stay current with your vaccines (immunizations). This information is not intended to replace advice given to you by your health care provider. Make sure you discuss any questions you have with your health care provider. Document Released: 12/24/2007 Document Revised: 07/18/2014 Document Reviewed: 03/31/2015 Elsevier Interactive Patient Education  2017 [employer] Inc[employer]  

## 2016-06-16 NOTE — Progress Notes (Signed)
Subjective:    Patient ID: Aaron Cherry, male    DOB: [birth year], 55 y.o.   MRN: [id number]  HPI He is here for a physical exam.   He has no concerns. He denies changes in his health.   Medications and allergies reviewed with patient and updated if appropriate.  Patient Active Problem List   Diagnosis Date Noted  . Hyperglycemia 06/16/2016  . DIVERTICULOSIS, COLON 03/18/2009  . History of colonic polyps 03/18/2009  . Hyperlipidemia 01/23/2009  . Sarcoidosis (HCC) 11/26/2007    Current Outpatient Prescriptions on File Prior to Visit  Medication Sig Dispense Refill  . Multiple Vitamins-Minerals (MEGA MULTI MEN PO) Take by mouth daily.       No current facility-administered medications on file prior to visit.     Past Medical History:  Diagnosis Date  . Diverticulosis   . Hyperlipidemia   . Keratoconjunctivitis  sicca   . Sarcoidosis (HCC) 1985   PMH of    Past Surgical History:  Procedure Laterality Date  . APPENDECTOMY    . COLONOSCOPY  2015   negative  . COLONOSCOPY W/ POLYPECTOMY  03/2009   [facility]; due 2015  . INGUINAL HERNIA REPAIR     Bilateral   . Neck Lumpectomy  age 58   benign  . POLYPECTOMY    . trans bronchial biopsy     [facility]; sarcoidosis  . WRIST FRACTURE SURGERY      Social History   Social History  . Marital status: Single    Spouse name: N/A  . Number of children: N/A  . Years of education: N/A   Social History Main Topics  . Smoking status: Former Smoker    Quit date: 07/11/1988  . Smokeless tobacco: Never Used     Comment: smoked 1976- 1990 , up to 1 ppWEEK  . Alcohol use Yes     Comment: 18 beers/week  . Drug use: No  . Sexual activity: Not Asked   Other Topics Concern  . None   Social History Narrative   Exercise: runs, weights    Family History  Problem Relation Age of Onset  . HIV Brother   . Heart attack Maternal Grandfather 65  . Heart murmur Mother   . Coronary artery disease Mother     MI @ 6484;  CBAG @ 2778  . Prostate cancer Paternal Grandfather   . Alzheimer's disease Father   . Colonic polyp Father   . Heart attack      M uncles X 2; @ 70 & 82  . Fibromyalgia Sister   . Prostate cancer Brother   . Diabetes Neg Hx   . Stroke Neg Hx     Review of Systems  Constitutional: Negative for appetite change, chills, fatigue, fever and unexpected weight change.  HENT: Negative for hearing loss.   Eyes: Negative for visual disturbance.  Respiratory: Negative for cough, shortness of breath and wheezing.   Cardiovascular: Negative for chest pain, palpitations and leg swelling.  Gastrointestinal: Negative for abdominal pain, blood in stool, constipation, diarrhea and nausea.       No gerd  Genitourinary: Negative for difficulty urinating, dysuria and hematuria.  Musculoskeletal: Positive for back pain (intermittent upper back pain). Negative for arthralgias and myalgias.  Skin: Negative for color change and rash.  Neurological: Negative for dizziness, light-headedness and headaches.  Psychiatric/Behavioral: Negative for dysphoric mood. The patient is not nervous/anxious.        Objective:   Vitals:  06/16/16 0844  BP: 116/76  Pulse: 70  Resp: 16  Temp: 98.1 F (36.7 C)   Filed Weights   06/16/16 0844  Weight: 191 lb (86.6 kg)   Body mass index is 24.52 kg/m.   Physical Exam Constitutional: He appears well-developed and well-nourished. No distress.  HENT:  Head: Normocephalic and atraumatic.  Right Ear: External ear normal.  Left Ear: External ear normal.  Mouth/Throat: Oropharynx is clear and moist.  Normal ear canals and TM b/l  Eyes: Conjunctivae and EOM are normal.  Neck: Neck supple. No tracheal deviation present. No thyromegaly present.  No carotid bruit  Cardiovascular: Normal rate, regular rhythm, normal heart sounds and intact distal pulses.   No murmur heard. Pulmonary/Chest: Effort normal and breath sounds normal. No respiratory distress. He has no  wheezes. He has no rales.  Abdominal: Soft. Bowel sounds are normal. He exhibits no distension. There is no tenderness.  Genitourinary: deferred to urology Musculoskeletal: He exhibits no edema.  Lymphadenopathy:   He has no cervical adenopathy.  Skin: Skin is warm and dry. He is not diaphoretic.  Psychiatric: He has a normal mood and affect. His behavior is normal.         Assessment & Plan:   Physical exam: Screening blood work ordered Immunizations  tdap up to date, flu vaccine Colonoscopy  Up to date - due 2020 Eye exams  Had an exam a few years ago EKG - last done 2013 - normal Exercise -regular Weight - normal BMI Skin no concerns Substance abuse - none  See Problem List for Assessment and Plan of chronic medical problems.   Very healthy with healthy lifestyle - no changes needed F/u annually

## 2016-06-16 NOTE — Assessment & Plan Note (Addendum)
Sugar elevated last year, check a1c Checks sugar at home periodically and it is low

## 2017-01-10 ENCOUNTER — Ambulatory Visit (INDEPENDENT_AMBULATORY_CARE_PROVIDER_SITE_OTHER): Payer: 59 | Admitting: Internal Medicine

## 2017-01-10 ENCOUNTER — Encounter: Payer: Self-pay | Admitting: Internal Medicine

## 2017-01-10 DIAGNOSIS — M5432 Sciatica, left side: Secondary | ICD-10-CM

## 2017-01-10 MED ORDER — MELOXICAM 15 MG PO TABS
15.0000 mg | ORAL_TABLET | Freq: Every day | ORAL | 2 refills | Status: DC
Start: 2017-01-10 — End: 2017-08-03

## 2017-01-10 MED ORDER — CYCLOBENZAPRINE HCL 10 MG PO TABS: 10.0000 mg | ORAL_TABLET | Freq: Every day | ORAL | 0 refills | Status: DC

## 2017-01-10 NOTE — Assessment & Plan Note (Signed)
Probable sciatica No concerning symptoms/ signs so will hold off on imaging Heat/ice meloxicam 15 mg daily with food - no other nsaid while taking this Flexeril at night - advised it will cause drowsiness Back exercises given Will refer to sports med or PT if no improvement

## 2017-01-10 NOTE — Patient Instructions (Addendum)
Try ice and heat Start taking meloxicam 15 mg daily with food. Start taking the muscle relaxer, flexeril, at night.   Start stretches.   Call if no improvement    Back Exercises The following exercises strengthen the muscles that help to support the back. They also help to keep the lower back flexible. Doing these exercises can help to prevent back pain or lessen existing pain. If you have back pain or discomfort, try doing these exercises 2-3 times each day or as told by your health care provider. When the pain goes away, do them once each day, but increase the number of times that you repeat the steps for each exercise (do more repetitions). If you do not have back pain or discomfort, do these exercises once each day or as told by your health care provider. Exercises Single Knee to Chest  Repeat these steps 3-5 times for each leg: 1. Lie on your back on a firm bed or the floor with your legs extended. 2. Bring one knee to your chest. Your other leg should stay extended and in contact with the floor. 3. Hold your knee in place by grabbing your knee or thigh. 4. Pull on your knee until you feel a gentle stretch in your lower back. 5. Hold the stretch for 10-30 seconds. 6. Slowly release and straighten your leg.  Pelvic Tilt  Repeat these steps 5-10 times: 1. Lie on your back on a firm bed or the floor with your legs extended. 2. Bend your knees so they are pointing toward the ceiling and your feet are flat on the floor. 3. Tighten your lower abdominal muscles to press your lower back against the floor. This motion will tilt your pelvis so your tailbone points up toward the ceiling instead of pointing to your feet or the floor. 4. With gentle tension and even breathing, hold this position for 5-10 seconds.  Cat-Cow  Repeat these steps until your lower back becomes more flexible: 1. Get into a hands-and-knees position on a firm surface. Keep your hands under your shoulders, and keep  your knees under your hips. You may place padding under your knees for comfort. 2. Let your head hang down, and point your tailbone toward the floor so your lower back becomes rounded like the back of a cat. 3. Hold this position for 5 seconds. 4. Slowly lift your head and point your tailbone up toward the ceiling so your back forms a sagging arch like the back of a cow. 5. Hold this position for 5 seconds.  Press-Ups  Repeat these steps 5-10 times: 1. Lie on your abdomen (face-down) on the floor. 2. Place your palms near your head, about shoulder-width apart. 3. While you keep your back as relaxed as possible and keep your hips on the floor, slowly straighten your arms to raise the top half of your body and lift your shoulders. Do not use your back muscles to raise your upper torso. You may adjust the placement of your hands to make yourself more comfortable. 4. Hold this position for 5 seconds while you keep your back relaxed. 5. Slowly return to lying flat on the floor.  Bridges  Repeat these steps 10 times: 1. Lie on your back on a firm surface. 2. Bend your knees so they are pointing toward the ceiling and your feet are flat on the floor. 3. Tighten your buttocks muscles and lift your buttocks off of the floor until your waist is at almost the same  height as your knees. You should feel the muscles working in your buttocks and the back of your thighs. If you do not feel these muscles, slide your feet 1-2 inches farther away from your buttocks. 4. Hold this position for 3-5 seconds. 5. Slowly lower your hips to the starting position, and allow your buttocks muscles to relax completely.  If this exercise is too easy, try doing it with your arms crossed over your chest. Abdominal Crunches  Repeat these steps 5-10 times: 1. Lie on your back on a firm bed or the floor with your legs extended. 2. Bend your knees so they are pointing toward the ceiling and your feet are flat on the  floor. 3. Cross your arms over your chest. 4. Tip your chin slightly toward your chest without bending your neck. 5. Tighten your abdominal muscles and slowly raise your trunk (torso) high enough to lift your shoulder blades a tiny bit off of the floor. Avoid raising your torso higher than that, because it can put too much stress on your low back and it does not help to strengthen your abdominal muscles. 6. Slowly return to your starting position.  Back Lifts Repeat these steps 5-10 times: 1. Lie on your abdomen (face-down) with your arms at your sides, and rest your forehead on the floor. 2. Tighten the muscles in your legs and your buttocks. 3. Slowly lift your chest off of the floor while you keep your hips pressed to the floor. Keep the back of your head in line with the curve in your back. Your eyes should be looking at the floor. 4. Hold this position for 3-5 seconds. 5. Slowly return to your starting position.  Contact a health care provider if:  Your back pain or discomfort gets much worse when you do an exercise.  Your back pain or discomfort does not lessen within 2 hours after you exercise. If you have any of these problems, stop doing these exercises right away. Do not do them again unless your health care provider says that you can. Get help right away if:  You develop sudden, severe back pain. If this happens, stop doing the exercises right away. Do not do them again unless your health care provider says that you can. This information is not intended to replace advice given to you by your health care provider. Make sure you discuss any questions you have with your health care provider. Document Released: 08/04/2004 Document Revised: 11/04/2015 Document Reviewed: 08/21/2014 Elsevier Interactive Patient Education  2017 [employer].

## 2017-01-10 NOTE — Progress Notes (Signed)
Subjective:    Patient ID: Aaron Cherry, male    DOB: 1961-01-22, 56 y.o.   MRN: [id number]  HPI He is here for an acute visit.   Hip pain/buttock pain:  He has had pain intermittently for 4 months.  It can last up to a few weeks.  It has gone away on its own, but he has also done some stretches and unsure if they have helped.  His most recent flare of pain started three weeks ago just before going on vacation and has been persistent.  The pain starts in his buttock region and radiates down to this left knee.  It is worse first thing in the morning.  Sitting is painful and after sitting getting up is painful.  Walking will loosen it up.  He denies numbness/tingling and weakness in the leg.     He did take one ibuprofen and it helped.  He did not want to take it continuously so has not been taking it daily.         Medications and allergies reviewed with patient and updated if appropriate.  Patient Active Problem List   Diagnosis Date Noted  . Hyperglycemia 06/16/2016  . DIVERTICULOSIS, COLON 03/18/2009  . History of colonic polyps 03/18/2009  . Hyperlipidemia 01/23/2009  . Sarcoidosis (HCC) 11/26/2007    Current Outpatient Prescriptions on File Prior to Visit  Medication Sig Dispense Refill  . Multiple Vitamins-Minerals (MEGA MULTI MEN PO) Take by mouth daily.       No current facility-administered medications on file prior to visit.     Past Medical History:  Diagnosis Date  . Diverticulosis   . Hyperlipidemia   . Keratoconjunctivitis  sicca   . Sarcoidosis (HCC) 1985   PMH of    Past Surgical History:  Procedure Laterality Date  . APPENDECTOMY    . COLONOSCOPY  2015   negative  . COLONOSCOPY W/ POLYPECTOMY  03/2009   [facility]; due 2015  . INGUINAL HERNIA REPAIR     Bilateral   . Neck Lumpectomy  age 27   benign  . POLYPECTOMY    . trans bronchial biopsy     [facility]; sarcoidosis  . WRIST FRACTURE SURGERY      Social History   Social History  .  Marital status: Single    Spouse name: N/A  . Number of children: N/A  . Years of education: N/A   Social History Main Topics  . Smoking status: Former Smoker    Quit date: 07/11/1988  . Smokeless tobacco: Never Used     Comment: smoked 1976- 1990 , up to 1 ppWEEK  . Alcohol use Yes     Comment: 18 beers/week  . Drug use: No  . Sexual activity: Not on file   Other Topics Concern  . Not on file   Social History Narrative   Exercise: runs, weights    Family History  Problem Relation Age of Onset  . HIV Brother   . Heart attack Maternal Grandfather 65  . Heart murmur Mother   . Coronary artery disease Mother        MI @ 59; CBAG @ 16  . Prostate cancer Paternal Grandfather   . Alzheimer's disease Father   . Colonic polyp Father   . Heart attack Unknown        M uncles X 2; @ 70 & 82  . Fibromyalgia Sister   . Prostate cancer Brother   . Diabetes Neg  Hx   . Stroke Neg Hx     Review of Systems  Constitutional: Negative for chills and fever.  Musculoskeletal: Positive for myalgias (? muscle tightness). Negative for back pain and gait problem.  Neurological: Negative for weakness and numbness.       Objective:   Vitals:   01/10/17 1109  BP: 128/84  Pulse: 75  Resp: 16  Temp: 98 F (36.7 C)   Filed Weights   01/10/17 1109  Weight: 192 lb (87.1 kg)   Body mass index is 24.65 kg/m.  Wt Readings from Last 3 Encounters:  01/10/17 192 lb (87.1 kg)  06/16/16 191 lb (86.6 kg)  06/15/15 195 lb (88.5 kg)     Physical Exam  Constitutional: He appears well-developed and well-nourished. No distress.  Musculoskeletal:  No tenderness with palpation of lumbar spine, mild tenderness at left SI joint and buttock region, no lateral left hip tenderness; full ROM of spine with flexion/extension with minimal pain with extension  Neurological: He exhibits normal muscle tone. Coordination normal.  Normal sensation and strength in b/l LE; gait normal  Skin: He is not  diaphoretic.          Assessment & Plan:   See Problem List for Assessment and Plan of chronic medical problems.

## 2017-05-30 DIAGNOSIS — N401 Enlarged prostate with lower urinary tract symptoms: Secondary | ICD-10-CM | POA: Diagnosis not present

## 2017-06-05 DIAGNOSIS — Z125 Encounter for screening for malignant neoplasm of prostate: Secondary | ICD-10-CM | POA: Diagnosis not present

## 2017-06-05 DIAGNOSIS — R3912 Poor urinary stream: Secondary | ICD-10-CM | POA: Diagnosis not present

## 2017-06-05 DIAGNOSIS — N401 Enlarged prostate with lower urinary tract symptoms: Secondary | ICD-10-CM | POA: Diagnosis not present

## 2017-06-19 ENCOUNTER — Encounter: Payer: 59 | Admitting: Internal Medicine

## 2017-07-23 ENCOUNTER — Encounter: Payer: Self-pay | Admitting: Internal Medicine

## 2017-07-23 NOTE — Patient Instructions (Addendum)
Test(s) ordered today. Your results will be released to MyChart (or called to you) after review, usually within 72hours after test completion. If any changes need to be made, you will be notified at that same time.  All other Health Maintenance issues reviewed.   All recommended immunizations and age-appropriate screenings are up-to-date or discussed.  Flu immunization administered today.    Medications reviewed and updated.  No changes recommended at this time.   Please followup in one year    Health Maintenance, Male A healthy lifestyle and preventive care is important for your health and wellness. Ask your health care provider about what schedule of regular examinations is right for you. What should I know about weight and diet? Eat a Healthy Diet  Eat plenty of vegetables, fruits, whole grains, low-fat dairy products, and lean protein.  Do not eat a lot of foods high in solid fats, added sugars, or salt.  Maintain a Healthy Weight Regular exercise can help you achieve or maintain a healthy weight. You should:  Do at least 150 minutes of exercise each week. The exercise should increase your heart rate and make you sweat (moderate-intensity exercise).  Do strength-training exercises at least twice a week.  Watch Your Levels of Cholesterol and Blood Lipids  Have your blood tested for lipids and cholesterol every 5 years starting at 59 years of age. If you are at high risk for heart disease, you should start having your blood tested when you are 59 years old. You may need to have your cholesterol levels checked more often if: ? Your lipid or cholesterol levels are high. ? You are older than 59 years of age. ? You are at high risk for heart disease.  What should I know about cancer screening? Many types of cancers can be detected early and may often be prevented. Lung Cancer  You should be screened every year for lung cancer if: ? You are a current smoker who has smoked for  at least 30 years. ? You are a former smoker who has quit within the past 15 years.  Talk to your health care provider about your screening options, when you should start screening, and how often you should be screened.  Colorectal Cancer  Routine colorectal cancer screening usually begins at 59 years of age and should be repeated every 5-10 years until you are 59 years old. You may need to be screened more often if early forms of precancerous polyps or small growths are found. Your health care provider may recommend screening at an earlier age if you have risk factors for colon cancer.  Your health care provider may recommend using home test kits to check for hidden blood in the stool.  A small camera at the end of a tube can be used to examine your colon (sigmoidoscopy or colonoscopy). This checks for the earliest forms of colorectal cancer.  Prostate and Testicular Cancer  Depending on your age and overall health, your health care provider may do certain tests to screen for prostate and testicular cancer.  Talk to your health care provider about any symptoms or concerns you have about testicular or prostate cancer.  Skin Cancer  Check your skin from head to toe regularly.  Tell your health care provider about any new moles or changes in moles, especially if: ? There is a change in a mole's size, shape, or color. ? You have a mole that is larger than a pencil eraser.  Always use sunscreen. Apply sunscreen   liberally and repeat throughout the day.  Protect yourself by wearing long sleeves, pants, a wide-brimmed hat, and sunglasses when outside.  What should I know about heart disease, diabetes, and high blood pressure?  If you are 18-39 years of age, have your blood pressure checked every 3-5 years. If you are 40 years of age or older, have your blood pressure checked every year. You should have your blood pressure measured twice-once when you are at a hospital or clinic, and once  when you are not at a hospital or clinic. Record the average of the two measurements. To check your blood pressure when you are not at a hospital or clinic, you can use: ? An automated blood pressure machine at a pharmacy. ? A home blood pressure monitor.  Talk to your health care provider about your target blood pressure.  If you are between 45-79 years old, ask your health care provider if you should take aspirin to prevent heart disease.  Have regular diabetes screenings by checking your fasting blood sugar level. ? If you are at a normal weight and have a low risk for diabetes, have this test once every three years after the age of 45. ? If you are overweight and have a high risk for diabetes, consider being tested at a younger age or more often.  A one-time screening for abdominal aortic aneurysm (AAA) by ultrasound is recommended for men aged 65-75 years who are current or former smokers. What should I know about preventing infection? Hepatitis B If you have a higher risk for hepatitis B, you should be screened for this virus. Talk with your health care provider to find out if you are at risk for hepatitis B infection. Hepatitis C Blood testing is recommended for:  Everyone born from 1945 through 1965.  Anyone with known risk factors for hepatitis C.  Sexually Transmitted Diseases (STDs)  You should be screened each year for STDs including gonorrhea and chlamydia if: ? You are sexually active and are younger than 59 years of age. ? You are older than 59 years of age and your health care provider tells you that you are at risk for this type of infection. ? Your sexual activity has changed since you were last screened and you are at an increased risk for chlamydia or gonorrhea. Ask your health care provider if you are at risk.  Talk with your health care provider about whether you are at high risk of being infected with HIV. Your health care provider may recommend a prescription  medicine to help prevent HIV infection.  What else can I do?  Schedule regular health, dental, and eye exams.  Stay current with your vaccines (immunizations).  Do not use any tobacco products, such as cigarettes, chewing tobacco, and e-cigarettes. If you need help quitting, ask your health care provider.  Limit alcohol intake to no more than 2 drinks per day. One drink equals 12 ounces of beer, 5 ounces of wine, or 1 ounces of hard liquor.  Do not use street drugs.  Do not share needles.  Ask your health care provider for help if you need support or information about quitting drugs.  Tell your health care provider if you often feel depressed.  Tell your health care provider if you have ever been abused or do not feel safe at home. This information is not intended to replace advice given to you by your health care provider. Make sure you discuss any questions you have with your   health care provider. Document Released: 12/24/2007 Document Revised: 02/24/2016 Document Reviewed: 03/31/2015 Elsevier Interactive Patient Education  2018 Elsevier Inc.  

## 2017-07-23 NOTE — Progress Notes (Signed)
Subjective:    Patient ID: Aaron Cherry, male    DOB: 08-09-1960, 57 y.o.   MRN: [id number]  HPI He is here for a physical exam.   He has no concerns.  He feels well.  His back pain he had several months ago has gone away - mostly it improved with exercise.  Medications and allergies reviewed with patient and updated if appropriate.  Patient Active Problem List   Diagnosis Date Noted  . Sciatica of left side 01/10/2017  . Hyperglycemia 06/16/2016  . DIVERTICULOSIS, COLON 03/18/2009  . History of colonic polyps 03/18/2009  . Hyperlipidemia 01/23/2009  . Sarcoidosis (HCC) 11/26/2007    Current Outpatient Medications on File Prior to Visit  Medication Sig Dispense Refill  . cyclobenzaprine (FLEXERIL) 10 MG tablet Take 1 tablet (10 mg total) by mouth at bedtime. 30 tablet 0  . meloxicam (MOBIC) 15 MG tablet Take 1 tablet (15 mg total) by mouth daily. 30 tablet 2  . Multiple Vitamins-Minerals (MEGA MULTI MEN PO) Take by mouth daily.       No current facility-administered medications on file prior to visit.     Past Medical History:  Diagnosis Date  . Diverticulosis   . Hyperlipidemia   . Keratoconjunctivitis  sicca   . Sarcoidosis 1985   PMH of    Past Surgical History:  Procedure Laterality Date  . APPENDECTOMY    . COLONOSCOPY  2015   negative  . COLONOSCOPY W/ POLYPECTOMY  03/2009   [facility]; due 2015  . INGUINAL HERNIA REPAIR     Bilateral   . Neck Lumpectomy  age 30   benign  . POLYPECTOMY    . trans bronchial biopsy     [facility]; sarcoidosis  . WRIST FRACTURE SURGERY      Social History   Socioeconomic History  . Marital status: Single    Spouse name: None  . Number of children: None  . Years of education: None  . Highest education level: None  Social Needs  . Financial resource strain: None  . Food insecurity - worry: None  . Food insecurity - inability: None  . Transportation needs - medical: None  . Transportation needs -  non-medical: None  Occupational History  . None  Tobacco Use  . Smoking status: Former Smoker    Last attempt to quit: 07/11/1988    Years since quitting: 29.0  . Smokeless tobacco: Never Used  . Tobacco comment: smoked 1976- 1990 , up to 1 ppWEEK  Substance and Sexual Activity  . Alcohol use: Yes    Comment: 18 beers/week  . Drug use: No  . Sexual activity: None  Other Topics Concern  . None  Social History Narrative   Exercise: runs, weights    Family History  Problem Relation Age of Onset  . HIV Brother   . Heart attack Maternal Grandfather 65  . Heart murmur Mother   . Coronary artery disease Mother        MI @ 2684; CBAG @ 1878  . Prostate cancer Paternal Grandfather   . Alzheimer's disease Father   . Colonic polyp Father   . Heart attack Unknown        M uncles X 2; @ 70 & 82  . Fibromyalgia Sister   . Prostate cancer Brother   . Diabetes Neg Hx   . Stroke Neg Hx     Review of Systems  Constitutional: Negative for chills and fever.  Eyes: Negative  for visual disturbance.  Respiratory: Negative for cough, shortness of breath and wheezing.   Cardiovascular: Negative for chest pain, palpitations and leg swelling.  Gastrointestinal: Negative for abdominal pain, blood in stool, constipation, diarrhea and nausea.       No gerd  Genitourinary: Negative for difficulty urinating, dysuria and hematuria.  Musculoskeletal: Negative for arthralgias, back pain and myalgias.  Skin: Negative for color change and rash.       One mole on face - ? changed  Neurological: Negative for dizziness, light-headedness and headaches.  Psychiatric/Behavioral: Negative for dysphoric mood. The patient is not nervous/anxious.        Objective:   Vitals:   07/25/17 0806  BP: 114/82  Pulse: 72  Resp: 16  Temp: 98.2 F (36.8 C)  SpO2: 99%   Filed Weights   07/25/17 0806  Weight: 196 lb (88.9 kg)   Body mass index is 25.16 kg/m.  Wt Readings from Last 3 Encounters:  07/25/17 196  lb (88.9 kg)  01/10/17 192 lb (87.1 kg)  06/16/16 191 lb (86.6 kg)     Physical Exam Constitutional: He appears well-developed and well-nourished. No distress.  HENT:  Head: Normocephalic and atraumatic.  Right Ear: External ear normal.  Left Ear: External ear normal.  Mouth/Throat: Oropharynx is clear and moist.  Normal ear canals and TM b/l  Eyes: Conjunctivae and EOM are normal.  Neck: Neck supple. No tracheal deviation present. No thyromegaly present.  No carotid bruit  Cardiovascular: Normal rate, regular rhythm, normal heart sounds and intact distal pulses.   No murmur heard. Pulmonary/Chest: Effort normal and breath sounds normal. No respiratory distress. He has no wheezes. He has no rales.  Abdominal: Soft. He exhibits no distension. There is no tenderness.  Genitourinary: deferred - done by urology Musculoskeletal: He exhibits no edema.  Lymphadenopathy:   He has no cervical adenopathy.  Skin: Skin is warm and dry. He is not diaphoretic. no concerning moles on face Psychiatric: He has a normal mood and affect. His behavior is normal.         Assessment & Plan:   Physical exam: Screening blood work   ordered Immunizations   Td up to date, flu today , discussed shingrix Colonoscopy  Up to date  Eye exams  Not up to date - advised to schedule Exercise  Running, doing weights Weight  BMI is good Skin one mole looked at  - appears normal - advised him to keep an eye on it, no other skin abnormalities/concerns Substance abuse   none  See Problem List for Assessment and Plan of chronic medical problems.

## 2017-07-25 ENCOUNTER — Ambulatory Visit (INDEPENDENT_AMBULATORY_CARE_PROVIDER_SITE_OTHER): Payer: 59 | Admitting: Internal Medicine

## 2017-07-25 ENCOUNTER — Encounter: Payer: Self-pay | Admitting: Internal Medicine

## 2017-07-25 ENCOUNTER — Other Ambulatory Visit (INDEPENDENT_AMBULATORY_CARE_PROVIDER_SITE_OTHER): Payer: 59

## 2017-07-25 VITALS — BP 114/82 | HR 72 | Temp 98.2°F | Resp 16 | Ht 74.0 in | Wt 196.0 lb

## 2017-07-25 DIAGNOSIS — Z23 Encounter for immunization: Secondary | ICD-10-CM | POA: Diagnosis not present

## 2017-07-25 DIAGNOSIS — R739 Hyperglycemia, unspecified: Secondary | ICD-10-CM

## 2017-07-25 DIAGNOSIS — E7849 Other hyperlipidemia: Secondary | ICD-10-CM

## 2017-07-25 DIAGNOSIS — Z0000 Encounter for general adult medical examination without abnormal findings: Secondary | ICD-10-CM

## 2017-07-25 LAB — COMPREHENSIVE METABOLIC PANEL
ALT: 20 U/L (ref 0–53)
AST: 22 U/L (ref 0–37)
Albumin: 4.5 g/dL (ref 3.5–5.2)
Alkaline Phosphatase: 72 U/L (ref 39–117)
BUN: 14 mg/dL (ref 6–23)
CO2: 28 meq/L (ref 19–32)
Calcium: 9.4 mg/dL (ref 8.4–10.5)
Chloride: 103 mEq/L (ref 96–112)
Creatinine, Ser: 0.94 mg/dL (ref 0.40–1.50)
GFR: 87.43 mL/min (ref >60.00)
GLUCOSE: 108 mg/dL — AB (ref 70–99)
POTASSIUM: 4.1 meq/L (ref 3.5–5.1)
Sodium: 138 mEq/L (ref 135–145)
Total Bilirubin: 0.8 mg/dL (ref 0.2–1.2)
Total Protein: 7.1 g/dL (ref 6.0–8.3)

## 2017-07-25 LAB — CBC WITH DIFFERENTIAL/PLATELET
Basophils Absolute: 0 10*3/uL (ref 0.0–0.1)
Basophils Relative: 0.8 % (ref 0.0–3.0)
EOS PCT: 2.1 % (ref 0.0–5.0)
Eosinophils Absolute: 0.1 10*3/uL (ref 0.0–0.7)
HEMATOCRIT: 49.8 % (ref 39.0–52.0)
Hemoglobin: 17.2 g/dL — ABNORMAL HIGH (ref 13.0–17.0)
LYMPHS ABS: 1.4 10*3/uL (ref 0.7–4.0)
LYMPHS PCT: 25.6 % (ref 12.0–46.0)
MCHC: 34.6 g/dL (ref 30.0–36.0)
MCV: 93.7 fl (ref 78.0–100.0)
MONOS PCT: 8.4 % (ref 3.0–12.0)
Monocytes Absolute: 0.5 10*3/uL (ref 0.1–1.0)
NEUTROS ABS: 3.5 10*3/uL (ref 1.4–7.7)
NEUTROS PCT: 63.1 % (ref 43.0–77.0)
PLATELETS: 273 10*3/uL (ref 150.0–400.0)
RBC: 5.31 Mil/uL (ref 4.22–5.81)
RDW: 12.6 % (ref 11.5–15.5)
WBC: 5.6 10*3/uL (ref 4.0–10.5)

## 2017-07-25 LAB — HEMOGLOBIN A1C: HEMOGLOBIN A1C: 5 % (ref 4.6–6.5)

## 2017-07-25 LAB — LIPID PANEL
CHOL/HDL RATIO: 3
Cholesterol: 225 mg/dL — ABNORMAL HIGH (ref 0–200)
HDL: 76.4 mg/dL (ref >39.00)
LDL CALC: 138 mg/dL — AB (ref 0–99)
NONHDL: 148.27
Triglycerides: 53 mg/dL (ref 0.0–149.0)
VLDL: 10.6 mg/dL (ref 0.0–40.0)

## 2017-07-25 LAB — TSH: TSH: 2.49 u[IU]/mL (ref 0.35–4.50)

## 2017-07-25 NOTE — Assessment & Plan Note (Signed)
a1c

## 2017-07-25 NOTE — Assessment & Plan Note (Signed)
Controlled with diet Check lipid panel, cmp, tsh Continue regular exercise, healthy diet

## 2017-07-26 LAB — PSA, TOTAL AND FREE
PSA, % Free: 18 % (calc) — ABNORMAL LOW (ref >25)
PSA, FREE: 0.3 ng/mL
PSA, TOTAL: 1.7 ng/mL (ref <=4.0)

## 2017-07-27 ENCOUNTER — Encounter: Payer: Self-pay | Admitting: Internal Medicine

## 2017-08-03 ENCOUNTER — Ambulatory Visit: Payer: 59 | Admitting: Family Medicine

## 2017-08-03 ENCOUNTER — Encounter: Payer: Self-pay | Admitting: Family Medicine

## 2017-08-03 VITALS — BP 138/84 | HR 72 | Temp 98.2°F | Ht 74.0 in | Wt 195.0 lb

## 2017-08-03 DIAGNOSIS — K8050 Calculus of bile duct without cholangitis or cholecystitis without obstruction: Secondary | ICD-10-CM

## 2017-08-03 NOTE — Patient Instructions (Signed)
Please continue using ibuprofen for the pain  Please avoid fatty foods.  Please seek immediate care if the pain is out of proportion    Biliary Colic, Adult Biliary colic is severe pain caused by a problem with a small organ in the upper right part of your belly (gallbladder). The gallbladder stores a digestive fluid produced in the liver (bile) that helps the body break down fat. Bile and other digestive enzymes are carried from the liver to the small intestine though tube-like structures (bile ducts). The gallbladder and the bile ducts form the biliary tract. Sometimes hard deposits of digestive fluids form in the gallbladder (gallstones) and block the flow of bile from the gallbladder, causing biliary colic. This condition is also called a gallbladder attack. Gallstones can be as small as a grain of sand or as big as a golf ball. There could be just one gallstone in the gallbladder, or there could be many. What are the causes? Biliary colic is usually caused by gallstones. Less often, a tumor could block the flow of bile from the gallbladder and trigger biliary colic. What increases the risk? This condition is more likely to develop in:  Women.  People of Hispanic descent.  People with a family history of gallstones.  People who are obese.  People who suddenly or quickly lose weight.  People who eat a high-calorie, low-fiber diet that is rich in refined carbs (carbohydrates), such as white bread and white rice.  People who have an intestinal disease that affects nutrient absorption, such as Crohn disease.  People who have a metabolic condition, such as metabolic syndrome or diabetes.  What are the signs or symptoms? Severe pain in the upper right side of the belly is the main symptom of biliary colic. You may feel this pain below the chest but above the hip. This pain often occurs at night or after eating a very fatty meal. This pain may get worse for up to an hour and last as long  as 12 hours. In most cases, the pain fades (subsides) within a couple hours. Other symptoms of this condition include:  Nausea and vomiting.  Pain under the right shoulder.  How is this diagnosed? This condition is diagnosed based on your medical history, your symptoms, and a physical exam. You may have tests, including:  Blood tests to rule out infection or inflammation of the bile ducts, gallbladder, pancreas, or liver.  Imaging studies such as: ? Ultrasound. ? CT scan. ? MRI.  In some cases, you may need to have an imaging study done using a small amount of radioactive material (nuclear medicine) to confirm the diagnosis. How is this treated? Treatment for this condition may include medicine to relieve your pain or nausea. If you have gallstones that are causing biliary colic, you may need surgery to remove the gallbladder (cholecystectomy). Gallstones can also be dissolved gradually with medicine. It may take months or years before the gallstones are completely gone. Follow these instructions at home:  Take over-the-counter and prescription medicines only as told by your health care provider.  Drink enough fluid to keep your urine clear or pale yellow.  Follow instructions from your health care provider about eating or drinking restrictions. These may include avoiding: ? Fatty, greasy, and fried foods. ? Any foods that make the pain worse. ? Overeating. ? Having a large meal after not eating for a while.  Keep all follow-up visits as told by your health care provider. This is important. How is  this prevented? Steps to prevent this condition include:  Maintaining a healthy body weight.  Getting regular exercise.  Eating a healthy, high-fiber, low-fat diet.  Limiting how much sugar and refined carbs you eat, such as sweets, white flour, and white rice.  Contact a health care provider if:  Your pain lasts more than 5 hours.  You vomit.  You have a fever and  chills.  Your pain gets worse. Get help right away if:  Your skin or the whites of your eyes look yellow (jaundice).  Your have tea-colored urine and light-colored stools.  You are dizzy or you faint. This information is not intended to replace advice given to you by your health care provider. Make sure you discuss any questions you have with your health care provider. Document Released: 11/28/2005 Document Revised: 02/23/2016 Document Reviewed: 01/11/2016 Elsevier Interactive Patient Education  [employer]2018 Elsevier Inc.

## 2017-08-03 NOTE — Progress Notes (Signed)
Aaron DownsMichael E Cherry - 57 y.o. male MRN [id number]  Date of birth: [birth year]  SUBJECTIVE:  Including CC & ROS.  Chief Complaint  Patient presents with  . Right Flank pain    Aaron Cherry is a 57 y.o. male that is presenting with right flank pain. Ongoing for two weeks. Pain radiates is described as a burning senstion to anteriorly to his right upper quadrant. Admits to swelling and tenderness is present. Denies injury or prior surgeries. He has not taken anything for the pain. He exercises daily running and weight lifting. He states the pain is more noticeable when he eats. Denies any food triggers.  Admits to diarrhea. Denies fever or nausea/vomitting. Denies any rash. Has not had any injury or trauma to his flank wall. Denies any vomiting. Pain is intermittent and sharp in nature. Pain is moderate to severe when it does occur. He has been taking anti-inflammatories with improvement of the pain. This feels different then his diverticular problem.  Review of Systems  Constitutional: Negative for fever.  HENT: Negative for sinus pain.   Respiratory: Negative for cough.   Cardiovascular: Negative for chest pain.  Gastrointestinal: Positive for abdominal pain and diarrhea.  Musculoskeletal: Negative for gait problem.  Skin: Negative for color change.  Neurological: Negative for weakness.  Hematological: Negative for adenopathy.  Psychiatric/Behavioral: Negative for agitation.    HISTORY: Past Medical, Surgical, Social, and Family History Reviewed & Updated per EMR.   Pertinent Historical Findings include:  Past Medical History:  Diagnosis Date  . Diverticulosis   . Hyperlipidemia   . Keratoconjunctivitis  sicca   . Sarcoidosis 1985   PMH of    Past Surgical History:  Procedure Laterality Date  . APPENDECTOMY    . COLONOSCOPY  2015   negative  . COLONOSCOPY W/ POLYPECTOMY  03/2009    [facility]; due 2015  . INGUINAL HERNIA REPAIR     Bilateral   . Neck Lumpectomy  age 25   benign  . POLYPECTOMY    . trans bronchial biopsy     [facility]; sarcoidosis  . WRIST FRACTURE SURGERY      No Known Allergies  Family History  Problem Relation Age of Onset  . HIV Brother   . Heart attack Maternal Grandfather 65  . Heart murmur Mother   . Coronary artery disease Mother        MI @ 5084; CBAG @ 6678  . Prostate cancer Paternal Grandfather   . Alzheimer's disease Father   . Colonic polyp Father   . Heart attack Unknown        M uncles X 2; @ 70 & 82  . Fibromyalgia Sister   . Prostate cancer Brother   . Diabetes Neg Hx   . Stroke Neg Hx      Social History   Socioeconomic History  . Marital status: Single    Spouse name: Not on file  . Number of children: Not on file  . Years of education: Not on file  . Highest education level: Not on file  Social Needs  . Financial resource strain: Not on file  . Food insecurity - worry: Not on file  . Food insecurity - inability: Not on file  . Transportation needs - medical: Not on file  . Transportation needs - non-medical: Not on file  Occupational History  . Not on file  Tobacco Use  . Smoking status: Former Smoker    Last attempt to quit: 07/11/1988  Years since quitting: 29.0  . Smokeless tobacco: Never Used  . Tobacco comment: smoked 1976- 1990 , up to 1 ppWEEK  Substance and Sexual Activity  . Alcohol use: Yes    Comment: 18 beers/week  . Drug use: No  . Sexual activity: Not on file  Other Topics Concern  . Not on file  Social History Narrative   Exercise: runs, weights     PHYSICAL EXAM:  VS: BP 138/84 (BP Location: Left Arm, Patient Position: Sitting, Cuff Size: Normal)   Pulse 72   Temp 98.2 F (36.8 C) (Oral)   Ht 6\' 2"  (1.88 m)   Wt 195 lb (88.5 kg)   SpO2 98%   BMI 25.04 kg/m  Physical Exam Gen: NAD, alert, cooperative with exam, well-appearing ENT: normal lips, normal nasal mucosa,  Eye: normal EOM, normal conjunctiva and lids CV:  no edema, +2 pedal pulses   Resp: no accessory  muscle use, non-labored,  GI: no masses, tenderness to palpation of the right mid axillary line. Positive Murphy sign, no hernia  Skin: no rashes, no areas of induration  Neuro: normal tone, normal sensation to touch Psych:  normal insight, alert and oriented MSK: normal gait, normal strength, no rib step-offs appreciated      ASSESSMENT & PLAN:   Biliary colic Symptoms seem to be associated with his gallbladder. No suggestion of shingles or associated with any rib abnormality. - Referral to surgery - Given indications to seek immediate care.

## 2017-08-03 NOTE — Assessment & Plan Note (Signed)
Symptoms seem to be associated with his gallbladder. No suggestion of shingles or associated with any rib abnormality. - Referral to surgery - Given indications to seek immediate care.

## 2017-08-14 ENCOUNTER — Encounter: Payer: Self-pay | Admitting: Surgery

## 2017-08-14 ENCOUNTER — Ambulatory Visit: Payer: Self-pay | Admitting: Surgery

## 2017-08-14 DIAGNOSIS — R1011 Right upper quadrant pain: Secondary | ICD-10-CM | POA: Diagnosis not present

## 2017-08-14 NOTE — H&P (Signed)
Aaron DownsMichael E Cherry Documented: 08/14/2017 1:40 PM Location: [place] Office Patient #: [id number] DOB: September 22, 1960 Single / Language: [place] / Race: White Male  History of Present Illness (Haddy Mullinax A. Jovin Fester MD; 08/14/2017 1:58 PM) Patient words: this is a very pleasant and otherwise healthy 57 year old gentleman is referred for possible biliary colic. he has had on and off right flank and mid back pain for about a year. he initially thought it may be related to physical activity as he does workout lift weights pretty regularly. He has noticed recently that it has begun to radiate around to the right subcostal margin and is exacerbated by eating. He keeps to a low-fat diet in general but has been more strict about this recently without much improvement. The last attack of pain was this past weekend. The pain is associated with nausea. He notices that drinking a beer or 2 does alleviate the pain actually.  Prior abdominal surgery includes open bilateral inguinal hernias, the right at age 57 in the left at age 57, as well as an open appendectomy.  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  The patient is a 57 year old male.   Past Surgical History [name]([name], CMA; 08/14/2017 1:40 PM) Appendectomy Colon Polyp Removal - Colonoscopy Knee Surgery Right. Laparoscopic Inguinal Hernia Surgery Left. Open Inguinal Hernia Surgery Right.  Diagnostic Studies History [name]([name], CMA; 08/14/2017 1:40 PM) Colonoscopy 1-5 years ago  Allergies [name]([name], CMA; 08/14/2017 1:41 PM) No Known Drug Allergies [08/14/2017]:  Medication History [name]([name], CMA; 08/14/2017 1:42 PM) Multivitamins/Minerals (Oral) Active. Medications Reconciled  Social History [name]([name], CMA; 08/14/2017 1:40 PM) Alcohol use Moderate alcohol use. Caffeine use Coffee. No drug use Tobacco use Former smoker.  Family History [name]([name], CMA; 08/14/2017 1:40 PM) Alcohol Abuse Family Members In  General. Depression Sister. Diabetes Mellitus Sister. Hypertension Mother. Kidney Disease Brother. Migraine Headache Son. Ovarian Cancer Sister. Prostate Cancer Brother. Respiratory Condition Father.  Other Problems [name]([name], CMA; 08/14/2017 1:40 PM) Back Pain Diverticulosis Inguinal Hernia     Review of Systems ([name] CMA; 08/14/2017 1:40 PM) General Present- Appetite Loss and Weight Loss. Not Present- Chills, Fatigue, Fever, Night Sweats and Weight Gain. Skin Not Present- Change in Wart/Mole, Dryness, Hives, Jaundice, New Lesions, Non-Healing Wounds, Rash and Ulcer. HEENT Not Present- Earache, Hearing Loss, Hoarseness, Nose Bleed, Oral Ulcers, Ringing in the Ears, Seasonal Allergies, Sinus Pain, Sore Throat, Visual Disturbances, Wears glasses/contact lenses and Yellow Eyes. Respiratory Present- Snoring. Not Present- Bloody sputum, Chronic Cough, Difficulty Breathing and Wheezing. Breast Not Present- Breast Mass, Breast Pain, Nipple Discharge and Skin Changes. Cardiovascular Not Present- Chest Pain, Difficulty Breathing Lying Down, Leg Cramps, Palpitations, Rapid Heart Rate, Shortness of Breath and Swelling of Extremities. Gastrointestinal Present- Abdominal Pain, Bloating, Change in Bowel Habits, Excessive gas and Nausea. Not Present- Bloody Stool, Chronic diarrhea, Constipation, Difficulty Swallowing, Gets full quickly at meals, Hemorrhoids, Indigestion, Rectal Pain and Vomiting. Male Genitourinary Not Present- Blood in Urine, Change in Urinary Stream, Frequency, Impotence, Nocturia, Painful Urination, Urgency and Urine Leakage. Musculoskeletal Present- Back Pain and Swelling of Extremities. Not Present- Joint Pain, Joint Stiffness, Muscle Pain and Muscle Weakness. Neurological Not Present- Decreased Memory, Fainting, Headaches, Numbness, Seizures, Tingling, Tremor, Trouble walking and Weakness. Psychiatric Not Present- Anxiety, Bipolar, Change in Sleep Pattern,  Depression, Fearful and Frequent crying. Endocrine Not Present- Cold Intolerance, Excessive Hunger, Hair Changes, Heat Intolerance, Hot flashes and New Diabetes. Hematology Not Present- Blood Thinners, Easy Bruising, Excessive bleeding, Gland problems, HIV and  Persistent Infections.  Vitals ([name] CMA; 08/14/2017 1:41 PM) 08/14/2017 1:41 PM Weight: 191.25 lb Height: 74in Body Surface Area: 2.13 m Body Mass Index: 24.55 kg/m  Temp.: 98.66F  Pulse: 74 (Regular)  P.OX: 98% (Room air) BP: 128/78 (Sitting, Left Arm, Standard)      Physical Exam (Skylynn Burkley A. Fredricka Bonine MD; 08/14/2017 2:00 PM)  General Note: alert and well appearing  Integumentary Note: warm and dry  Head and Neck Note: no mass or thyromegaly  Eye Note: No scleral icterus. Extra ocular motions intact.  ENMT Note: Moist mucous membranes, dentition intact  Chest and Lung Exam Note: Unlabored respirations, clear bilaterally  Cardiovascular Note: Regular rate and rhythm, no pedal edema  Abdomen Note: Soft, nontender nondistended. No mass or organomegaly. well-healed right lower quadrant transverse incision and bilateral groin incisions. No hernia.  Neurologic Note: Grossly intact, normal gait  Neuropsychiatric Note: Normal mood and affect. Appropriate insight.  Musculoskeletal Note: Strength symmetrical throughout, postoperative scar of the left wrist, no other deformity    Assessment & Plan (Scottie Stanish A. Thamara Leger MD; 08/14/2017 2:02 PM)  RIGHT UPPER QUADRANT PAIN (R10.11) Story: the symptoms sound typical for biliary colic. He has not had any imaging to confirm this diagnosis. We will obtain a right upper quadrant ultrasound. I discussed with him that if this demonstrates stones, A cholecystectomy would be indicated. If there are no stones on ultrasound, we will get a HIDA scan to evaluate for biliary dyskinesia. Assuming we can objectively confirm the diagnosis of biliary colic, we'll  proceed with laparoscopic cholecystectomy. I discussed the surgery with him including the technique, the risks of bleeding, infection, pain, scarring, intra-abdominal injury, specifically to the common bile duct and sequelae, as well as the general risks of undergoing anesthesia. He expressed understanding. His questions were welcomed and answered to his satisfaction.

## 2017-08-16 ENCOUNTER — Other Ambulatory Visit: Payer: Self-pay | Admitting: Surgery

## 2017-08-16 DIAGNOSIS — R1011 Right upper quadrant pain: Secondary | ICD-10-CM

## 2017-08-18 ENCOUNTER — Ambulatory Visit
Admission: RE | Admit: 2017-08-18 | Discharge: 2017-08-18 | Disposition: A | Discharge status: Home / Self Care | Payer: 59 | Source: Ambulatory Visit | Attending: Surgery | Admitting: Surgery

## 2017-08-18 ENCOUNTER — Other Ambulatory Visit (HOSPITAL_COMMUNITY): Payer: Self-pay | Admitting: Surgery

## 2017-08-18 DIAGNOSIS — R1011 Right upper quadrant pain: Secondary | ICD-10-CM

## 2017-08-21 ENCOUNTER — Ambulatory Visit (HOSPITAL_COMMUNITY)
Admission: RE | Admit: 2017-08-21 | Discharge: 2017-08-21 | Disposition: A | Discharge status: Home / Self Care | Payer: 59 | Source: Ambulatory Visit | Attending: Surgery | Admitting: Surgery

## 2017-08-21 DIAGNOSIS — R1011 Right upper quadrant pain: Secondary | ICD-10-CM | POA: Insufficient documentation

## 2017-08-21 MED ORDER — TECHNETIUM TC 99M MEBROFENIN IV KIT
5.0000 | PACK | Freq: Once | INTRAVENOUS | Status: AC | PRN
  Administered 2017-08-21: 5 via INTRAVENOUS

## 2017-10-12 ENCOUNTER — Encounter (HOSPITAL_COMMUNITY): Payer: Self-pay

## 2017-10-12 NOTE — Pre-Procedure Instructions (Signed)
Algis DownsMichael E Schiffer  10/12/2017      [facility] [address] - [place], [place] - [address] [place] [address] Phone: [phone] Fax: [phone]    Your procedure is scheduled on Friday April 12.  [facility].M.  Call this number if you have problems the morning of surgery:  [phone]   Remember:  Do not eat food or drink liquids after midnight.  Take these medicines the morning of surgery with A SIP OF WATER: NONE  7 days prior to surgery STOP taking any Aspirin(unless otherwise instructed by your surgeon), Aleve, Naproxen, Ibuprofen, Motrin, Advil, Goody's, BC's, all herbal medications, fish oil, and all vitamins    Do not wear jewelry, make-up or nail polish.  Do not wear lotions, powders, or perfumes, or deodorant.  Do not shave 48 hours prior to surgery.  Men may shave face and neck.  Do not bring valuables to the hospital.  [facility] is not [facility] for any belongings or valuables.  Contacts, dentures or bridgework may not be worn into surgery.  Leave your suitcase in the car.  After surgery it may be brought to your room.  For patients admitted to the hospital, discharge time will be determined by your treatment team.  Patients discharged the day of surgery will not be allowed to drive home.   Special instructions:    [place]- Preparing For Surgery  Before surgery, you can play an important role. Because skin is not sterile, your skin needs to be as free of germs as possible. You can reduce the number of germs on your skin by washing with CHG (chlorahexidine gluconate) Soap before surgery.  CHG is an antiseptic cleaner which kills germs and bonds with the skin to continue killing germs even after washing.  Please do not use if you have an allergy to CHG or antibacterial soaps. If your skin becomes reddened/irritated stop using the CHG.  Do not shave (including legs and  underarms) for at least 48 hours prior to first CHG shower. It is OK to shave your face.  Please follow these instructions carefully.   1. Shower the NIGHT BEFORE SURGERY and the MORNING OF SURGERY with CHG.   2. If you chose to wash your hair, wash your hair first as usual with your normal shampoo.  3. After you shampoo, rinse your hair and body thoroughly to remove the shampoo.  4. Use CHG as you would any other liquid soap. You can apply CHG directly to the skin and wash gently with a scrungie or a clean washcloth.   5. Apply the CHG Soap to your body ONLY FROM THE NECK DOWN.  Do not use on open wounds or open sores. Avoid contact with your eyes, ears, mouth and genitals (private parts). Wash Face and genitals (private parts)  with your normal soap.  6. Wash thoroughly, paying special attention to the area where your surgery will be performed.  7. Thoroughly rinse your body with warm water from the neck down.  8. DO NOT shower/wash with your normal soap after using and rinsing off the CHG Soap.  9. Pat yourself dry with a CLEAN TOWEL.  10. Wear CLEAN PAJAMAS to bed the night before surgery, wear comfortable clothes the morning of surgery  11. Place CLEAN SHEETS on your bed the night of your first shower and DO NOT SLEEP WITH PETS.    Day of Surgery: Do not apply  any deodorants/lotions. Please wear clean clothes to the hospital/surgery center.      Please read over the following fact sheets that you were given. Coughing and Deep Breathing and Surgical Site Infection Prevention

## 2017-10-13 ENCOUNTER — Encounter (HOSPITAL_COMMUNITY): Payer: Self-pay

## 2017-10-13 ENCOUNTER — Encounter (HOSPITAL_COMMUNITY)
Admission: RE | Admit: 2017-10-13 | Discharge: 2017-10-13 | Disposition: A | Discharge status: Home / Self Care | Payer: 59 | Source: Ambulatory Visit | Attending: Surgery | Admitting: Surgery

## 2017-10-13 ENCOUNTER — Other Ambulatory Visit: Payer: Self-pay

## 2017-10-13 DIAGNOSIS — Z01812 Encounter for preprocedural laboratory examination: Secondary | ICD-10-CM | POA: Diagnosis present

## 2017-10-13 LAB — CBC
HCT: 45 % (ref 39.0–52.0)
Hemoglobin: 15.3 g/dL (ref 13.0–17.0)
MCH: 31.4 pg (ref 26.0–34.0)
MCHC: 34 g/dL (ref 30.0–36.0)
MCV: 92.4 fL (ref 78.0–100.0)
PLATELETS: 218 10*3/uL (ref 150–400)
RBC: 4.87 MIL/uL (ref 4.22–5.81)
RDW: 12.3 % (ref 11.5–15.5)
WBC: 4.5 10*3/uL (ref 4.0–10.5)

## 2017-10-19 NOTE — Anesthesia Preprocedure Evaluation (Signed)
Anesthesia Evaluation  Patient identified by MRN, date of birth, ID band Patient awake    Reviewed: Allergy & Precautions, H&P , Patient's Chart, lab work & pertinent test results, reviewed documented beta blocker date and time   Airway Mallampati: II  TM Distance: >3 FB Neck ROM: full    Dental no notable dental hx.    Pulmonary former smoker,    Pulmonary exam normal breath sounds clear to auscultation       Cardiovascular  Rhythm:regular Rate:Normal     Neuro/Psych    GI/Hepatic   Endo/Other    Renal/GU      Musculoskeletal   Abdominal   Peds  Hematology   Anesthesia Other Findings Hx Sarcoidosis  Reproductive/Obstetrics                             Anesthesia Physical Anesthesia Plan  ASA: II  Anesthesia Plan: General   Post-op Pain Management:    Induction: Intravenous  PONV Risk Score and Plan: 2 and Dexamethasone, Ondansetron and Treatment may vary due to age or medical condition  Airway Management Planned: Oral ETT  Additional Equipment:   Intra-op Plan:   Post-operative Plan: Extubation in OR  Informed Consent: I have reviewed the patients History and Physical, chart, labs and discussed the procedure including the risks, benefits and alternatives for the proposed anesthesia with the patient or authorized representative who has indicated his/her understanding and acceptance.   Dental Advisory Given  Plan Discussed with: CRNA and Surgeon  Anesthesia Plan Comments: (  )        Anesthesia Quick Evaluation

## 2017-10-20 ENCOUNTER — Encounter (HOSPITAL_COMMUNITY)
Admission: RE | Disposition: A | Discharge status: Home / Self Care | Payer: Self-pay | Source: Ambulatory Visit | Attending: Surgery

## 2017-10-20 ENCOUNTER — Ambulatory Visit (HOSPITAL_COMMUNITY): Payer: 59 | Admitting: Certified Registered"

## 2017-10-20 ENCOUNTER — Encounter (HOSPITAL_COMMUNITY): Payer: Self-pay

## 2017-10-20 ENCOUNTER — Ambulatory Visit (HOSPITAL_COMMUNITY)
Admission: RE | Admit: 2017-10-20 | Discharge: 2017-10-20 | Disposition: A | Discharge status: Home / Self Care | Payer: 59 | Source: Ambulatory Visit | Attending: Surgery | Admitting: Surgery

## 2017-10-20 DIAGNOSIS — Z87891 Personal history of nicotine dependence: Secondary | ICD-10-CM | POA: Diagnosis not present

## 2017-10-20 DIAGNOSIS — Z8719 Personal history of other diseases of the digestive system: Secondary | ICD-10-CM | POA: Diagnosis not present

## 2017-10-20 DIAGNOSIS — K828 Other specified diseases of gallbladder: Secondary | ICD-10-CM | POA: Insufficient documentation

## 2017-10-20 DIAGNOSIS — D869 Sarcoidosis, unspecified: Secondary | ICD-10-CM | POA: Insufficient documentation

## 2017-10-20 DIAGNOSIS — E785 Hyperlipidemia, unspecified: Secondary | ICD-10-CM | POA: Diagnosis not present

## 2017-10-20 DIAGNOSIS — K811 Chronic cholecystitis: Secondary | ICD-10-CM | POA: Diagnosis not present

## 2017-10-20 HISTORY — PX: CHOLECYSTECTOMY: SHX55

## 2017-10-20 SURGERY — LAPAROSCOPIC CHOLECYSTECTOMY
Anesthesia: General

## 2017-10-20 MED ORDER — ROCURONIUM BROMIDE 10 MG/ML (PF) SYRINGE
PREFILLED_SYRINGE | INTRAVENOUS | Status: DC | PRN
  Administered 2017-10-20: 50 mg via INTRAVENOUS
  Administered 2017-10-20: 30 mg via INTRAVENOUS

## 2017-10-20 MED ORDER — SODIUM CHLORIDE 0.9 % IR SOLN
Status: DC | PRN
  Administered 2017-10-20: 1000 mL

## 2017-10-20 MED ORDER — FENTANYL CITRATE (PF) 100 MCG/2ML IJ SOLN: 25.0000 ug | INTRAMUSCULAR | Status: DC | PRN

## 2017-10-20 MED ORDER — PROPOFOL 10 MG/ML IV BOLUS
INTRAVENOUS | Status: DC | PRN
  Administered 2017-10-20: 170 mg via INTRAVENOUS

## 2017-10-20 MED ORDER — KETOROLAC TROMETHAMINE 30 MG/ML IJ SOLN
INTRAMUSCULAR | Status: AC
  Filled 2017-10-20: qty 1

## 2017-10-20 MED ORDER — DEXAMETHASONE SODIUM PHOSPHATE 10 MG/ML IJ SOLN
INTRAMUSCULAR | Status: DC | PRN
  Administered 2017-10-20: 10 mg via INTRAVENOUS

## 2017-10-20 MED ORDER — LIDOCAINE 2% (20 MG/ML) 5 ML SYRINGE
INTRAMUSCULAR | Status: DC | PRN
  Administered 2017-10-20: 60 mg via INTRAVENOUS

## 2017-10-20 MED ORDER — ACETAMINOPHEN 500 MG PO TABS
1000.0000 mg | ORAL_TABLET | ORAL | Status: AC
  Administered 2017-10-20: 1000 mg via ORAL
  Filled 2017-10-20: qty 2

## 2017-10-20 MED ORDER — PROMETHAZINE HCL 25 MG/ML IJ SOLN
6.2500 mg | INTRAMUSCULAR | Status: DC | PRN
  Administered 2017-10-20 (×2): 6.25 mg via INTRAVENOUS

## 2017-10-20 MED ORDER — SODIUM CHLORIDE 0.9% FLUSH: 3.0000 mL | Freq: Two times a day (BID) | INTRAVENOUS | Status: DC

## 2017-10-20 MED ORDER — SODIUM CHLORIDE 0.9% FLUSH: 3.0000 mL | INTRAVENOUS | Status: DC | PRN

## 2017-10-20 MED ORDER — FENTANYL CITRATE (PF) 250 MCG/5ML IJ SOLN
INTRAMUSCULAR | Status: AC
  Filled 2017-10-20: qty 5

## 2017-10-20 MED ORDER — ACETAMINOPHEN 325 MG PO TABS: 650.0000 mg | ORAL_TABLET | ORAL | Status: DC | PRN

## 2017-10-20 MED ORDER — DEXAMETHASONE SODIUM PHOSPHATE 10 MG/ML IJ SOLN
INTRAMUSCULAR | Status: AC
  Filled 2017-10-20: qty 1

## 2017-10-20 MED ORDER — CELECOXIB 200 MG PO CAPS
200.0000 mg | ORAL_CAPSULE | ORAL | Status: AC
  Administered 2017-10-20: 200 mg via ORAL
  Filled 2017-10-20: qty 1

## 2017-10-20 MED ORDER — LACTATED RINGERS IV SOLN
INTRAVENOUS | Status: DC | PRN
  Administered 2017-10-20 (×2): via INTRAVENOUS

## 2017-10-20 MED ORDER — SUGAMMADEX SODIUM 200 MG/2ML IV SOLN
INTRAVENOUS | Status: AC
  Filled 2017-10-20: qty 2

## 2017-10-20 MED ORDER — ONDANSETRON HCL 4 MG/2ML IJ SOLN
INTRAMUSCULAR | Status: AC
  Filled 2017-10-20: qty 2

## 2017-10-20 MED ORDER — CEFAZOLIN SODIUM-DEXTROSE 2-4 GM/100ML-% IV SOLN
2.0000 g | INTRAVENOUS | Status: AC
  Administered 2017-10-20: 2 g via INTRAVENOUS
  Filled 2017-10-20: qty 100

## 2017-10-20 MED ORDER — ACETAMINOPHEN 650 MG RE SUPP: 650.0000 mg | RECTAL | Status: DC | PRN

## 2017-10-20 MED ORDER — SUGAMMADEX SODIUM 200 MG/2ML IV SOLN
INTRAVENOUS | Status: DC | PRN
  Administered 2017-10-20: 172 mg via INTRAVENOUS

## 2017-10-20 MED ORDER — FENTANYL CITRATE (PF) 250 MCG/5ML IJ SOLN
INTRAMUSCULAR | Status: DC | PRN
  Administered 2017-10-20: 100 ug via INTRAVENOUS
  Administered 2017-10-20: 50 ug via INTRAVENOUS
  Administered 2017-10-20: 100 ug via INTRAVENOUS

## 2017-10-20 MED ORDER — ONDANSETRON HCL 4 MG/2ML IJ SOLN
INTRAMUSCULAR | Status: DC | PRN
  Administered 2017-10-20: 4 mg via INTRAVENOUS

## 2017-10-20 MED ORDER — EPHEDRINE 5 MG/ML INJ
INTRAVENOUS | Status: AC
  Filled 2017-10-20: qty 10

## 2017-10-20 MED ORDER — LIDOCAINE 2% (20 MG/ML) 5 ML SYRINGE
INTRAMUSCULAR | Status: AC
  Filled 2017-10-20: qty 5

## 2017-10-20 MED ORDER — ROCURONIUM BROMIDE 10 MG/ML (PF) SYRINGE
PREFILLED_SYRINGE | INTRAVENOUS | Status: AC
  Filled 2017-10-20: qty 5

## 2017-10-20 MED ORDER — MIDAZOLAM HCL 5 MG/5ML IJ SOLN
INTRAMUSCULAR | Status: DC | PRN
  Administered 2017-10-20: 2 mg via INTRAVENOUS

## 2017-10-20 MED ORDER — KETOROLAC TROMETHAMINE 30 MG/ML IJ SOLN
INTRAMUSCULAR | Status: DC | PRN
  Administered 2017-10-20: 30 mg via INTRAVENOUS

## 2017-10-20 MED ORDER — OXYCODONE-ACETAMINOPHEN 5-325 MG PO TABS: 1.0000 | ORAL_TABLET | Freq: Four times a day (QID) | ORAL | 0 refills | Status: DC | PRN

## 2017-10-20 MED ORDER — 0.9 % SODIUM CHLORIDE (POUR BTL) OPTIME
TOPICAL | Status: DC | PRN
  Administered 2017-10-20: 1000 mL

## 2017-10-20 MED ORDER — CHLORHEXIDINE GLUCONATE 4 % EX LIQD: 60.0000 mL | Freq: Once | CUTANEOUS | Status: DC

## 2017-10-20 MED ORDER — PROPOFOL 10 MG/ML IV BOLUS
INTRAVENOUS | Status: AC
Start: 2017-10-20 — End: ?
  Filled 2017-10-20: qty 20

## 2017-10-20 MED ORDER — OXYCODONE HCL 5 MG PO TABS: 5.0000 mg | ORAL_TABLET | ORAL | Status: DC | PRN

## 2017-10-20 MED ORDER — PROMETHAZINE HCL 25 MG/ML IJ SOLN
INTRAMUSCULAR | Status: AC
  Administered 2017-10-20: 6.25 mg via INTRAVENOUS
  Filled 2017-10-20: qty 1

## 2017-10-20 MED ORDER — BUPIVACAINE-EPINEPHRINE 0.25% -1:200000 IJ SOLN
INTRAMUSCULAR | Status: DC | PRN
  Administered 2017-10-20: 12 mL

## 2017-10-20 MED ORDER — BUPIVACAINE-EPINEPHRINE (PF) 0.25% -1:200000 IJ SOLN
INTRAMUSCULAR | Status: AC
  Filled 2017-10-20: qty 30

## 2017-10-20 MED ORDER — SODIUM CHLORIDE 0.9 % IV SOLN: 250.0000 mL | INTRAVENOUS | Status: DC | PRN

## 2017-10-20 MED ORDER — EPHEDRINE SULFATE-NACL 50-0.9 MG/10ML-% IV SOSY
PREFILLED_SYRINGE | INTRAVENOUS | Status: DC | PRN
  Administered 2017-10-20: 10 mg via INTRAVENOUS

## 2017-10-20 MED ORDER — GABAPENTIN 300 MG PO CAPS
300.0000 mg | ORAL_CAPSULE | ORAL | Status: AC
  Administered 2017-10-20: 300 mg via ORAL
  Filled 2017-10-20: qty 1

## 2017-10-20 MED ORDER — ONDANSETRON HCL 4 MG/2ML IJ SOLN
4.0000 mg | Freq: Once | INTRAMUSCULAR | Status: AC
  Administered 2017-10-20: 4 mg via INTRAVENOUS

## 2017-10-20 MED ORDER — ONDANSETRON HCL 4 MG/2ML IJ SOLN
INTRAMUSCULAR | Status: AC
  Administered 2017-10-20: 4 mg via INTRAVENOUS
  Filled 2017-10-20: qty 2

## 2017-10-20 MED ORDER — MIDAZOLAM HCL 2 MG/2ML IJ SOLN
INTRAMUSCULAR | Status: AC
  Filled 2017-10-20: qty 2

## 2017-10-20 MED ORDER — DOCUSATE SODIUM 100 MG PO CAPS: 100.0000 mg | ORAL_CAPSULE | Freq: Two times a day (BID) | ORAL | 0 refills | Status: AC

## 2017-10-20 SURGICAL SUPPLY — 38 items
ADH SKN CLS APL DERMABOND .7 (GAUZE/BANDAGES/DRESSINGS) ×1
APPLIER CLIP 5 13 M/L LIGAMAX5 (MISCELLANEOUS) ×2
APR CLP MED LRG 5 ANG JAW (MISCELLANEOUS) ×1
BAG SPEC RTRVL LRG 6X4 10 (ENDOMECHANICALS) ×1
BLADE CLIPPER SURG (BLADE) IMPLANT
CANISTER SUCT 3000ML PPV (MISCELLANEOUS) ×2 IMPLANT
CHLORAPREP W/TINT 26ML (MISCELLANEOUS) ×2 IMPLANT
CLIP APPLIE 5 13 M/L LIGAMAX5 (MISCELLANEOUS) ×1 IMPLANT
COVER SURGICAL LIGHT HANDLE (MISCELLANEOUS) ×2 IMPLANT
DERMABOND ADVANCED (GAUZE/BANDAGES/DRESSINGS) ×1
DERMABOND ADVANCED .7 DNX12 (GAUZE/BANDAGES/DRESSINGS) ×1 IMPLANT
ELECT REM PT RETURN 9FT ADLT (ELECTROSURGICAL) ×2
ELECTRODE REM PT RTRN 9FT ADLT (ELECTROSURGICAL) ×1 IMPLANT
GLOVE BIO SURGEON STRL SZ 6 (GLOVE) ×2 IMPLANT
GLOVE BIOGEL PI IND STRL 6.5 (GLOVE) ×1 IMPLANT
GLOVE BIOGEL PI INDICATOR 6.5 (GLOVE) ×1
GOWN STRL REUS W/ TWL LRG LVL3 (GOWN DISPOSABLE) ×3 IMPLANT
GOWN STRL REUS W/TWL LRG LVL3 (GOWN DISPOSABLE) ×6
GRASPER SUT TROCAR 14GX15 (MISCELLANEOUS) ×2 IMPLANT
KIT BASIN OR (CUSTOM PROCEDURE TRAY) ×2 IMPLANT
KIT TURNOVER KIT B (KITS) ×2 IMPLANT
NDL INSUFFLATION 14GA 120MM (NEEDLE) ×1 IMPLANT
NEEDLE INSUFFLATION 14GA 120MM (NEEDLE) ×2 IMPLANT
NS IRRIG 1000ML POUR BTL (IV SOLUTION) ×2 IMPLANT
PAD ARMBOARD 7.5X6 YLW CONV (MISCELLANEOUS) ×2 IMPLANT
POUCH SPECIMEN RETRIEVAL 10MM (ENDOMECHANICALS) ×2 IMPLANT
SCISSORS LAP 5X35 DISP (ENDOMECHANICALS) ×2 IMPLANT
SET IRRIG TUBING LAPAROSCOPIC (IRRIGATION / IRRIGATOR) ×2 IMPLANT
SLEEVE ENDOPATH XCEL 5M (ENDOMECHANICALS) ×4 IMPLANT
SPECIMEN JAR SMALL (MISCELLANEOUS) ×2 IMPLANT
SUT MNCRL AB 4-0 PS2 18 (SUTURE) ×2 IMPLANT
TOWEL OR 17X24 6PK STRL BLUE (TOWEL DISPOSABLE) ×2 IMPLANT
TOWEL OR 17X26 10 PK STRL BLUE (TOWEL DISPOSABLE) IMPLANT
TRAY LAPAROSCOPIC MC (CUSTOM PROCEDURE TRAY) ×2 IMPLANT
TROCAR XCEL NON-BLD 11X100MML (ENDOMECHANICALS) ×2 IMPLANT
TROCAR XCEL NON-BLD 5MMX100MML (ENDOMECHANICALS) ×2 IMPLANT
TUBING INSUFFLATION (TUBING) ×2 IMPLANT
WATER STERILE IRR 1000ML POUR (IV SOLUTION) ×2 IMPLANT

## 2017-10-20 NOTE — Transfer of Care (Signed)
Immediate Anesthesia Transfer of Care Note  Patient: Aaron DownsMichael E Rue  Procedure(s) Performed: LAPAROSCOPIC CHOLECYSTECTOMY (N/A )  Patient Location: PACU  Anesthesia Type:General  Level of Consciousness: drowsy and patient cooperative  Airway & Oxygen Therapy: Patient Spontanous Breathing and Patient connected to face mask oxygen  Post-op Assessment: Report given to RN, Post -op Vital signs reviewed and stable and Patient moving all extremities X 4  Post vital signs: Reviewed and stable  Last Vitals:  Vitals Value Taken Time  BP    Temp    Pulse    Resp    SpO2      Last Pain:  Vitals:   10/20/17 0646  TempSrc:   PainSc: 0-No pain         Complications: No apparent anesthesia complications

## 2017-10-20 NOTE — Anesthesia Postprocedure Evaluation (Signed)
Anesthesia Post Note  Patient: Algis DownsMichael E Reh  Procedure(s) Performed: LAPAROSCOPIC CHOLECYSTECTOMY (N/A )     Patient location during evaluation: PACU Anesthesia Type: General Level of consciousness: awake and alert Pain management: pain level controlled Vital Signs Assessment: post-procedure vital signs reviewed and stable Respiratory status: spontaneous breathing, nonlabored ventilation, respiratory function stable and patient connected to nasal cannula oxygen Cardiovascular status: blood pressure returned to baseline and stable Postop Assessment: no apparent nausea or vomiting Anesthetic complications: no    Last Vitals:  Vitals:   10/20/17 0945 10/20/17 1010  BP: 108/73 110/73  Pulse: (!) 58 (!) 54  Resp: 11 12  Temp: 36.4 C   SpO2: 99% 100%    Last Pain:  Vitals:   10/20/17 0945  TempSrc:   PainSc: 0-No pain                 Lonnel Gjerde EDWARD

## 2017-10-20 NOTE — Op Note (Signed)
Operative Note  Algis DownsMichael E Byrom 57 y.o. male [id number]  10/20/2017  Surgeon: Berna Buehelsea A Kutler Vanvranken MD  Assistant: OR staff  Procedure performed: Laparoscopic Cholecystectomy  Preop diagnosis: biliary dyskinesia Post-op diagnosis/intraop findings: same  Specimens: gallbladder  EBL: minimal  Complications: none  Description of procedure: After obtaining informed consent the patient was brought to the operating room. Prophylactic antibiotics were administered. SCD's were applied. General endotracheal anesthesia was initiated and a formal time-out was performed. The abdomen was prepped and draped in the usual sterile fashion and the abdomen was entered using an infraumbilical veress needle after instilling the site with local. Insufflation to 15mmHg was obtained and gross inspection revealed no evidence of injury from our entry or other intraabdominal abnormalities. Two 5mm trocars were introduced in the right midclavicular and right anterior axillary lines under direct visualization and following infiltration with local. An 11mm trocar was placed in the epigastrium to the right of the falciform. The gallbladder was retracted cephalad and the infundibulum was retracted laterally. A combination of hook electrocautery and blunt dissection was utilized to clear the peritoneum from the neck and cystic duct, circumferentially isolating the cystic artery and cystic duct and lifting the gallbladder from the cystic plate. The critical view of safety was achieved with the cystic artery, cystic duct, and liver bed visualized between them with no other structures. The artery had two small branches anterior and posterior onto the gallbladder. These were clipped proximally and divided distally with cautery. The cystic duct was clipped with two clips on the proximal end and divided sharply. The gallbladder was dissected from the liver plate and the peritoneum containing the main cystic artery using electrocautery.  Once freed the gallbladder was placed in an endocatch bag and removed intact through the epigastric trocar site. A small amount of bleeding on the liver bed was controlled with cautery.Hemostasis was once again confirmed, and reinspection of the abdomen revealed no injuries. The clips were well opposed without any bile leak from the duct or the liver bed. Although the cystic artery was large, it clearly terminated in the redundant peritoneum which had been on the anterior surface of the gallbladder. I elected to place an additional clip on the cystic artery. The 11mm trocar site in the epigastrium was closed with a 0 vicryl in the fascia under direct visualization using a PMI device. The abdomen was desufflated and all trocars removed. The skin incisions were closed with running subcuticular monocryl and Dermabond. The patient was awakened, extubated and transported to the recovery room in stable condition.   All counts were correct at the completion of the case.

## 2017-10-20 NOTE — Anesthesia Procedure Notes (Signed)
Procedure Name: Intubation Date/Time: 10/20/2017 7:40 AM Performed by: Freddie Breech, CRNA Pre-anesthesia Checklist: Patient identified, Emergency Drugs available, Suction available and Patient being monitored Patient Re-evaluated:Patient Re-evaluated prior to induction Oxygen Delivery Method: Circle System Utilized Preoxygenation: Pre-oxygenation with 100% oxygen Induction Type: IV induction Ventilation: Mask ventilation without difficulty Laryngoscope Size: Mac and 4 Grade View: Grade II Tube type: Oral Tube size: 7.5 mm Number of attempts: 1 Airway Equipment and Method: Stylet and Oral airway Placement Confirmation: ETT inserted through vocal cords under direct vision,  positive ETCO2 and breath sounds checked- equal and bilateral Secured at: 23 cm Tube secured with: Tape Dental Injury: Teeth and Oropharynx as per pre-operative assessment

## 2017-10-20 NOTE — Discharge Instructions (Signed)
[facility] ______[facility] LAPAROSCOPIC SURGERY: POST OP INSTRUCTIONS Always review your discharge instruction sheet given to you by the facility where your surgery was performed. IF YOU HAVE DISABILITY OR FAMILY LEAVE FORMS, YOU MUST BRING THEM TO THE OFFICE FOR PROCESSING.  DO NOT GIVE THEM TO YOUR DOCTOR.  1. A prescription for pain medication may be given to you upon discharge.  Take your pain medication as prescribed, if needed.  If narcotic pain medicine is not needed, then you may take acetaminophen (Tylenol) or ibuprofen (Advil) as needed. 2. Take your usually prescribed medications unless otherwise directed. 3. If you need a refill on your pain medication, please contact your pharmacy.  They will contact our office to request authorization. Prescriptions will not be filled after 5pm or on week-ends. 4. You should follow a light diet the first few days after arrival home, such as soup and crackers, etc.  Be sure to include lots of fluids daily. 5. Most patients will experience some swelling and bruising in the area of the incisions.  Ice packs will help.  Swelling and bruising can take several days to resolve.  6. It is common to experience some constipation if taking pain medication after surgery.  Increasing fluid intake and taking a stool softener (such as Colace) will usually help or prevent this problem from occurring.  A mild laxative (Milk of Magnesia or Miralax) should be taken according to package instructions if there are no bowel movements after 48 hours. 7. Your surgeon used skin glue on the incision, you may shower in 24 hours.  The glue will flake off over the next 2-3 weeks. Let the soap and water run over the incisions and pat dry. No rubbing, scrubbing, lotions or ointments to incisions. 8. ACTIVITIES:  You may resume regular (light) daily activities beginning the next day--such as daily self-care, walking, climbing stairs--gradually increasing activities as  tolerated.  You may have sexual intercourse when it is comfortable.  Refrain from any heavy lifting or straining until approved by your doctor. a. You may drive when you are no longer taking prescription pain medication, you can comfortably wear a seatbelt, and you can safely maneuver your car and apply brakes. b. RETURN TO WORK:  _1 week_________________________________________________________ 9. You should see your doctor in the office for a follow-up appointment approximately 2-3 weeks after your surgery.  Make sure that you call for this appointment within a day or two after you arrive home to insure a convenient appointment time. 10. OTHER INSTRUCTIONS: __________________________________________________________________________________________________________________________ __________________________________________________________________________________________________________________________ WHEN TO CALL YOUR DOCTOR: 1. Fever over 101.0 2. Inability to urinate 3. Continued bleeding from incision. 4. Increased pain, redness, or drainage from the incision. 5. Increasing abdominal pain  The clinic staff is available to answer your questions during regular business hours.  Please dont hesitate to call and ask to speak to one of the nurses for clinical concerns.  If you have a medical emergency, go to the nearest emergency room or call 911.  A surgeon from [facility] is always on call at the hospital. [address], [address], [place], [place]  [address] ? [address] [address], [place], [place]   [address] [phone] ? [phone] ? FAX [phone] Web site: [email or web address]

## 2017-10-20 NOTE — H&P (Signed)
Aaron Cherry Documented: 08/14/2017 1:40 PM Location: [place] [facility] Patient #: [id number] DOB: 03-18-1961 Single / Language: [name] / Race: White Male  History of Present Illness (Chelsea A. Connor MD; 08/14/2017 1:58 PM) Patient words: this is a very pleasant and otherwise healthy 57 year old gentleman is referred for possible biliary colic. he has had on and off right flank and mid back pain for about a year. he initially thought it may be related to physical activity as he does workout lift weights pretty regularly. He has noticed recently that it has begun to radiate around to the right subcostal margin and is exacerbated by eating. He keeps to a low-fat diet in general but has been more strict about this recently without much improvement. The last attack of pain was this past weekend. The pain is associated with nausea. He notices that drinking a beer or 2 does alleviate the pain actually.  Prior abdominal surgery includes open bilateral inguinal hernias, the right at age 77 in the left at age 68, as well as an open appendectomy.  He works as a Scientist, research (medical).  The patient is a 57 year old male.   Past Surgical History [name], CMA; 08/14/2017 1:40 PM) Appendectomy Colon Polyp Removal - Colonoscopy Knee Surgery Right. Laparoscopic Inguinal Hernia Surgery Left. Open Inguinal Hernia Surgery Right.  Diagnostic Studies History [name], CMA; 08/14/2017 1:40 PM) Colonoscopy 1-5 years ago  Allergies [name], CMA; 08/14/2017 1:41 PM) No Known Drug Allergies [08/14/2017]:  Medication History [name], CMA; 08/14/2017 1:42 PM) Multivitamins/Minerals (Oral) Active. Medications Reconciled  Social History [name], CMA; 08/14/2017 1:40 PM) Alcohol use Moderate alcohol use. Caffeine use Coffee. No drug use Tobacco use Former smoker.  Family History [name], CMA; 08/14/2017 1:40 PM) Alcohol Abuse Family  Members In General. Depression Sister. Diabetes Mellitus Sister. Hypertension Mother. Kidney Disease Brother. Migraine Headache Son. Ovarian Cancer Sister. Prostate Cancer Brother. Respiratory Condition Father.  Other Problems [name], CMA; 08/14/2017 1:40 PM) Back Pain Diverticulosis Inguinal Hernia     Review of Systems ([name] CMA; 08/14/2017 1:40 PM) General Present- Appetite Loss and Weight Loss. Not Present- Chills, Fatigue, Fever, Night Sweats and Weight Gain. Skin Not Present- Change in Wart/Mole, Dryness, Hives, Jaundice, New Lesions, Non-Healing Wounds, Rash and Ulcer. HEENT Not Present- Earache, Hearing Loss, Hoarseness, Nose Bleed, Oral Ulcers, Ringing in the Ears, Seasonal Allergies, Sinus Pain, Sore Throat, Visual Disturbances, Wears glasses/contact lenses and Yellow Eyes. Respiratory Present- Snoring. Not Present- Bloody sputum, Chronic Cough, Difficulty Breathing and Wheezing. Breast Not Present- Breast Mass, Breast Pain, Nipple Discharge and Skin Changes. Cardiovascular Not Present- Chest Pain, Difficulty Breathing Lying Down, Leg Cramps, Palpitations, Rapid Heart Rate, Shortness of Breath and Swelling of Extremities. Gastrointestinal Present- Abdominal Pain, Bloating, Change in Bowel Habits, Excessive gas and Nausea. Not Present- Bloody Stool, Chronic diarrhea, Constipation, Difficulty Swallowing, Gets full quickly at meals, Hemorrhoids, Indigestion, Rectal Pain and Vomiting. Male Genitourinary Not Present- Blood in Urine, Change in Urinary Stream, Frequency, Impotence, Nocturia, Painful Urination, Urgency and Urine Leakage. Musculoskeletal Present- Back Pain and Swelling of Extremities. Not Present- Joint Pain, Joint Stiffness, Muscle Pain and Muscle Weakness. Neurological Not Present- Decreased Memory, Fainting, Headaches, Numbness, Seizures, Tingling, Tremor, Trouble walking and Weakness. Psychiatric Not Present- Anxiety, Bipolar, Change in  Sleep Pattern, Depression, Fearful and Frequent crying. Endocrine Not Present- Cold Intolerance, Excessive Hunger, Hair Changes, Heat Intolerance, Hot flashes and New Diabetes. Hematology Not Present- Blood Thinners, Easy Bruising, Excessive bleeding, Gland problems, HIV and  Persistent Infections.  Vitals ([name] CMA; 08/14/2017 1:41 PM) 08/14/2017 1:41 PM Weight: 191.25 lb Height: 74in Body Surface Area: 2.13 m Body Mass Index: 24.55 kg/m  Temp.: 98.37F  Pulse: 74 (Regular)  P.OX: 98% (Room air) BP: 128/78 (Sitting, Left Arm, Standard)      Physical Exam (Chelsea A. Fredricka Bonineonnor MD; 08/14/2017 2:00 PM)  General Note: alert and well appearing  Integumentary Note: warm and dry  Head and Neck Note: no mass or thyromegaly  Eye Note: No scleral icterus. Extra ocular motions intact.  ENMT Note: Moist mucous membranes, dentition intact  Chest and Lung Exam Note: Unlabored respirations, clear bilaterally  Cardiovascular Note: Regular rate and rhythm, no pedal edema  Abdomen Note: Soft, nontender nondistended. No mass or organomegaly. well-healed right lower quadrant transverse incision and bilateral groin incisions. No hernia.  Neurologic Note: Grossly intact, normal gait  Neuropsychiatric Note: Normal mood and affect. Appropriate insight.  Musculoskeletal Note: Strength symmetrical throughout, postoperative scar of the left wrist, no other deformity    Assessment & Plan (Chelsea A. Connor MD; 08/14/2017 2:02 PM)  RIGHT UPPER QUADRANT PAIN (R10.11) Story: the symptoms sound typical for biliary colic. He has not had any imaging to confirm this diagnosis. We will obtain a right upper quadrant ultrasound. I discussed with him that if this demonstrates stones, A cholecystectomy would be indicated. If there are no stones on ultrasound, we will get a HIDA scan to evaluate for biliary dyskinesia. Assuming we can objectively confirm the  diagnosis of biliary colic, we'll proceed with laparoscopic cholecystectomy. I discussed the surgery with him including the technique, the risks of bleeding, infection, pain, scarring, intra-abdominal injury, specifically to the common bile duct and sequelae, as well as the general risks of undergoing anesthesia. He expressed understanding. His questions were welcomed and answered to his satisfaction.  US neg, HIDA shows biliary dyskinesia.

## 2017-10-21 ENCOUNTER — Encounter (HOSPITAL_COMMUNITY): Payer: Self-pay | Admitting: Surgery

## 2018-03-13 DIAGNOSIS — S81812A Laceration without foreign body, left lower leg, initial encounter: Secondary | ICD-10-CM | POA: Diagnosis not present

## 2018-05-11 ENCOUNTER — Encounter: Payer: Self-pay | Admitting: Internal Medicine

## 2018-05-11 ENCOUNTER — Ambulatory Visit (INDEPENDENT_AMBULATORY_CARE_PROVIDER_SITE_OTHER): Payer: 59 | Admitting: Internal Medicine

## 2018-05-11 ENCOUNTER — Other Ambulatory Visit (INDEPENDENT_AMBULATORY_CARE_PROVIDER_SITE_OTHER): Payer: 59

## 2018-05-11 VITALS — BP 124/78 | HR 71 | Temp 98.8°F | Resp 16 | Ht 74.0 in | Wt 190.8 lb

## 2018-05-11 DIAGNOSIS — R10A1 Flank pain, right side: Secondary | ICD-10-CM | POA: Insufficient documentation

## 2018-05-11 DIAGNOSIS — R1011 Right upper quadrant pain: Secondary | ICD-10-CM | POA: Diagnosis not present

## 2018-05-11 DIAGNOSIS — Z23 Encounter for immunization: Secondary | ICD-10-CM

## 2018-05-11 DIAGNOSIS — R109 Unspecified abdominal pain: Secondary | ICD-10-CM | POA: Diagnosis not present

## 2018-05-11 LAB — CBC WITH DIFFERENTIAL/PLATELET
BASOS ABS: 0 10*3/uL (ref 0.0–0.1)
BASOS PCT: 0.8 % (ref 0.0–3.0)
EOS ABS: 0.1 10*3/uL (ref 0.0–0.7)
Eosinophils Relative: 1.5 % (ref 0.0–5.0)
HCT: 46.6 % (ref 39.0–52.0)
HEMOGLOBIN: 16.2 g/dL (ref 13.0–17.0)
Lymphocytes Relative: 27.7 % (ref 12.0–46.0)
Lymphs Abs: 1.5 10*3/uL (ref 0.7–4.0)
MCHC: 34.7 g/dL (ref 30.0–36.0)
MCV: 93.1 fl (ref 78.0–100.0)
MONO ABS: 0.5 10*3/uL (ref 0.1–1.0)
Monocytes Relative: 9 % (ref 3.0–12.0)
NEUTROS PCT: 61 % (ref 43.0–77.0)
Neutro Abs: 3.3 10*3/uL (ref 1.4–7.7)
Platelets: 279 10*3/uL (ref 150.0–400.0)
RBC: 5 Mil/uL (ref 4.22–5.81)
RDW: 12.7 % (ref 11.5–15.5)
WBC: 5.5 10*3/uL (ref 4.0–10.5)

## 2018-05-11 LAB — URINALYSIS, ROUTINE W REFLEX MICROSCOPIC
Bilirubin Urine: NEGATIVE
Hgb urine dipstick: NEGATIVE
KETONES UR: NEGATIVE
Leukocytes, UA: NEGATIVE
Nitrite: NEGATIVE
PH: 6 (ref 5.0–8.0)
RBC / HPF: NONE SEEN (ref 0–?)
SPECIFIC GRAVITY, URINE: 1.015 (ref 1.000–1.030)
Total Protein, Urine: NEGATIVE
Urine Glucose: NEGATIVE
Urobilinogen, UA: 0.2 (ref 0.0–1.0)

## 2018-05-11 LAB — COMPREHENSIVE METABOLIC PANEL
ALBUMIN: 4.4 g/dL (ref 3.5–5.2)
ALK PHOS: 74 U/L (ref 39–117)
ALT: 18 U/L (ref 0–53)
AST: 19 U/L (ref 0–37)
BILIRUBIN TOTAL: 0.8 mg/dL (ref 0.2–1.2)
BUN: 24 mg/dL — AB (ref 6–23)
CALCIUM: 9.4 mg/dL (ref 8.4–10.5)
CO2: 26 mEq/L (ref 19–32)
CREATININE: 1.09 mg/dL (ref 0.40–1.50)
Chloride: 104 mEq/L (ref 96–112)
GFR: 73.49 mL/min (ref >60.00)
Glucose, Bld: 98 mg/dL (ref 70–99)
Potassium: 4.8 mEq/L (ref 3.5–5.1)
SODIUM: 140 meq/L (ref 135–145)
TOTAL PROTEIN: 6.7 g/dL (ref 6.0–8.3)

## 2018-05-11 NOTE — Patient Instructions (Signed)
  Tests ordered today. Your results will be released to MyChart (or called to you) after review, usually within 72hours after test completion. If any changes need to be made, you will be notified at that same time.   Medications reviewed and updated.  Changes include :   none   A Ct scan was ordered.     We will call you with the results.

## 2018-05-11 NOTE — Assessment & Plan Note (Signed)
Experiencing right mid back/flank and right upper quadrant tenderness-intermittent and feels similar to what he experienced prior to having his gallbladder removed, but somewhat different No other concerning GI or GU symptoms He did not have cholelithiasis so I doubt bile duct stone, concern for possible kidney stone, which was not evaluated on his prior imaging from the spring CT renal stone study CBC, CMP, UA, urine culture 

## 2018-05-11 NOTE — Assessment & Plan Note (Signed)
Experiencing right mid back/flank and right upper quadrant tenderness-intermittent and feels similar to what he experienced prior to having his gallbladder removed, but somewhat different No other concerning GI or GU symptoms He did not have cholelithiasis so I doubt bile duct stone, concern for possible kidney stone, which was not evaluated on his prior imaging from the spring CT renal stone study CBC, CMP, UA, urine culture

## 2018-05-11 NOTE — Progress Notes (Signed)
Subjective:    Patient ID: Aaron Cherry, male    DOB: Oct 27, 1960, 57 y.o.   MRN: [id number]  HPI The patient is here for an acute visit.   RUQ pain, right mid back pain:  He had his GB removed on 10/20/17.  For the past three weeks he has had RUQ radiating to the back - it feels similar to what he felt prior to having the GB removed.  He has pain when he puts pressure on the right middle back.  His pain often improves over night.  He is unsure if eating makes it worse or not - sometimes.  He did not have stones in his gallbladder-he had dyskinesia.  He denies any fevers, chills, changes in bowel habits, nausea, heartburn and urinary symptoms.  He has never had kidney stones.  Overall his pain is intermittent and currently he feels fine.  He is concerned because the pain has been going on for 3 weeks and feels similar to what he had in the spring.  Medications and allergies reviewed with patient and updated if appropriate.  Patient Active Problem List   Diagnosis Date Noted  . Biliary colic 08/03/2017  . Sciatica of left side 01/10/2017  . Hyperglycemia 06/16/2016  . DIVERTICULOSIS, COLON 03/18/2009  . History of colonic polyps 03/18/2009  . Hyperlipidemia 01/23/2009  . Sarcoidosis (HCC) 11/26/2007    No current outpatient medications on file prior to visit.   No current facility-administered medications on file prior to visit.     Past Medical History:  Diagnosis Date  . Diverticulosis   . Hyperlipidemia   . Keratoconjunctivitis  sicca   . Sarcoidosis 1985   PMH of    Past Surgical History:  Procedure Laterality Date  . APPENDECTOMY    . CHOLECYSTECTOMY N/A 10/20/2017   Procedure: LAPAROSCOPIC CHOLECYSTECTOMY;  Surgeon: Berna Bue, MD;  Location: [facility] OR;  Service: General;  Laterality: N/A;  . COLONOSCOPY  2015   negative  . COLONOSCOPY W/ POLYPECTOMY  03/2009   [facility]; due 2015  . INGUINAL HERNIA REPAIR     Bilateral   . Neck Lumpectomy  age 58   benign  . POLYPECTOMY    . trans bronchial biopsy     [facility]; sarcoidosis  . WRIST FRACTURE SURGERY      Social History   Socioeconomic History  . Marital status: Single    Spouse name: Not on file  . Number of children: Not on file  . Years of education: Not on file  . Highest education level: Not on file  Occupational History  . Not on file  Social Needs  . Financial resource strain: Not on file  . Food insecurity:    Worry: Not on file    Inability: Not on file  . Transportation needs:    Medical: Not on file    Non-medical: Not on file  Tobacco Use  . Smoking status: Former Smoker    Last attempt to quit: 07/11/1988    Years since quitting: 29.8  . Smokeless tobacco: Never Used  . Tobacco comment: smoked 1976- 1990 , up to 1 ppWEEK  Substance and Sexual Activity  . Alcohol use: Yes    Alcohol/week: 14.0 - 21.0 standard drinks    Types: 14 - 21 Cans of beer per week    Comment: (2-3 beers per day)  . Drug use: No  . Sexual activity: Not on file  Lifestyle  . Physical activity:  Days per week: Not on file    Minutes per session: Not on file  . Stress: Not on file  Relationships  . Social connections:    Talks on phone: Not on file    Gets together: Not on file    Attends religious service: Not on file    Active member of club or organization: Not on file    Attends meetings of clubs or organizations: Not on file    Relationship status: Not on file  Other Topics Concern  . Not on file  Social History Narrative   Exercise: runs, weights    Family History  Problem Relation Age of Onset  . HIV Brother   . Heart attack Maternal Grandfather 65  . Heart murmur Mother   . Coronary artery disease Mother        MI @ 21; CBAG @ 33  . Prostate cancer Paternal Grandfather   . Alzheimer's disease Father   . Colonic polyp Father   . Heart attack Unknown        M uncles X 2; @ 70 & 82  . Fibromyalgia Sister   . Prostate cancer Brother   . Diabetes Neg Hx     . Stroke Neg Hx     Review of Systems  Constitutional: Negative for appetite change, chills, fever and unexpected weight change.  Gastrointestinal: Positive for abdominal pain. Negative for blood in stool (no black stool), constipation, diarrhea and nausea.       No gerd  Genitourinary: Negative for difficulty urinating, dysuria, frequency and hematuria.  Musculoskeletal: Positive for back pain (mid right back).  Skin: Negative for rash.       Objective:   Vitals:   05/11/18 0807  BP: 124/78  Pulse: 71  Resp: 16  Temp: 98.8 F (37.1 C)  SpO2: 98%   BP Readings from Last 3 Encounters:  05/11/18 124/78  10/20/17 110/73  10/13/17 127/74   Wt Readings from Last 3 Encounters:  05/11/18 190 lb 12.8 oz (86.5 kg)  10/20/17 189 lb 9.6 oz (86 kg)  10/13/17 189 lb 9.6 oz (86 kg)   Body mass index is 24.5 kg/m.   Physical Exam  Constitutional: He appears well-developed and well-nourished.  Non-toxic appearance. He does not appear ill. No distress.  HENT:  Head: Normocephalic and atraumatic.  Abdominal: Soft. Normal appearance. There is no splenomegaly or hepatomegaly. There is no tenderness. There is CVA tenderness (Minimal right CVA tenderness with palpation). There is no rigidity, no rebound, no guarding, no tenderness at McBurney's point and negative Murphy's sign. No hernia.  Skin: Skin is warm and dry.           Assessment & Plan:    See Problem List for Assessment and Plan of chronic medical problems.

## 2018-05-12 LAB — URINE CULTURE
MICRO NUMBER: 91317244
RESULT: NO GROWTH
SPECIMEN QUALITY:: ADEQUATE

## 2018-05-13 ENCOUNTER — Encounter: Payer: Self-pay | Admitting: Internal Medicine

## 2018-05-14 ENCOUNTER — Ambulatory Visit (INDEPENDENT_AMBULATORY_CARE_PROVIDER_SITE_OTHER)
Admission: RE | Admit: 2018-05-14 | Discharge: 2018-05-14 | Disposition: A | Discharge status: Home / Self Care | Payer: 59 | Source: Ambulatory Visit | Attending: Internal Medicine | Admitting: Internal Medicine

## 2018-05-14 DIAGNOSIS — N200 Calculus of kidney: Secondary | ICD-10-CM | POA: Diagnosis not present

## 2018-05-14 DIAGNOSIS — R1011 Right upper quadrant pain: Secondary | ICD-10-CM | POA: Diagnosis not present

## 2018-06-01 ENCOUNTER — Encounter: Payer: Self-pay | Admitting: Internal Medicine

## 2018-06-05 DIAGNOSIS — N401 Enlarged prostate with lower urinary tract symptoms: Secondary | ICD-10-CM | POA: Diagnosis not present

## 2018-06-05 DIAGNOSIS — R3912 Poor urinary stream: Secondary | ICD-10-CM | POA: Diagnosis not present

## 2018-06-12 DIAGNOSIS — R3912 Poor urinary stream: Secondary | ICD-10-CM | POA: Diagnosis not present

## 2018-06-12 DIAGNOSIS — N401 Enlarged prostate with lower urinary tract symptoms: Secondary | ICD-10-CM | POA: Diagnosis not present

## 2018-06-12 DIAGNOSIS — N200 Calculus of kidney: Secondary | ICD-10-CM | POA: Diagnosis not present

## 2018-07-11 HISTORY — PX: UPPER GASTROINTESTINAL ENDOSCOPY: SHX188

## 2018-07-25 NOTE — Progress Notes (Signed)
Subjective:    Patient ID: Aaron Cherry, male    DOB: August 14, 1960, 58 y.o.   MRN: [id number]  HPI He is here for a physical exam.   GI symptoms: He continues to have GI symptoms that he was seen here for last year.  Imaging including an ultrasound, HIDA scan and CT scan in the past year revealed no cause.  He has Central abdominal pain to Right sided pain.  It is a burning pain when eating - certain foods make it worse.  It swells at times.  Passing gas and having a BM helps.  Antiacids do not help - one time it made it worse.  Most of the time his BM's are normal.  He thinks stress may make it worse.  He wonders if he has IBS.  Other than that he feels well and has no concerns.  He denies any changes in his health since he was here last.  Medications and allergies reviewed with patient and updated if appropriate.  Patient Active Problem List   Diagnosis Date Noted  . BPH (benign prostatic hyperplasia) 07/26/2018  . Right upper quadrant abdominal pain 05/11/2018  . Acute right flank pain 05/11/2018  . Biliary colic 08/03/2017  . Sciatica of left side 01/10/2017  . Hyperglycemia 06/16/2016  . DIVERTICULOSIS, COLON 03/18/2009  . History of colonic polyps 03/18/2009  . Hyperlipidemia 01/23/2009  . Sarcoidosis (HCC) 11/26/2007    No current outpatient medications on file prior to visit.   No current facility-administered medications on file prior to visit.     Past Medical History:  Diagnosis Date  . Diverticulosis   . Hyperlipidemia   . Keratoconjunctivitis  sicca   . Sarcoidosis 1985   PMH of    Past Surgical History:  Procedure Laterality Date  . APPENDECTOMY    . CHOLECYSTECTOMY N/A 10/20/2017   Procedure: LAPAROSCOPIC CHOLECYSTECTOMY;  Surgeon: Berna Bue, MD;  Location: [facility] OR;  Service: General;  Laterality: N/A;  . COLONOSCOPY  2015   negative  . COLONOSCOPY W/ POLYPECTOMY  03/2009   [facility]; due 2015  . INGUINAL HERNIA REPAIR     Bilateral   .  Neck Lumpectomy  age 45   benign  . POLYPECTOMY    . trans bronchial biopsy     [place]; sarcoidosis  . WRIST FRACTURE SURGERY      Social History   Socioeconomic History  . Marital status: Single    Spouse name: Not on file  . Number of children: Not on file  . Years of education: Not on file  . Highest education level: Not on file  Occupational History  . Not on file  Social Needs  . Financial resource strain: Not on file  . Food insecurity:    Worry: Not on file    Inability: Not on file  . Transportation needs:    Medical: Not on file    Non-medical: Not on file  Tobacco Use  . Smoking status: Former Smoker    Last attempt to quit: 07/11/1988    Years since quitting: 30.0  . Smokeless tobacco: Never Used  . Tobacco comment: smoked 1976- 1990 , up to 1 ppWEEK  Substance and Sexual Activity  . Alcohol use: Yes    Alcohol/week: 14.0 - 21.0 standard drinks    Types: 14 - 21 Cans of beer per week    Comment: (2-3 beers per day)  . Drug use: No  . Sexual activity: Not on  file  Lifestyle  . Physical activity:    Days per week: Not on file    Minutes per session: Not on file  . Stress: Not on file  Relationships  . Social connections:    Talks on phone: Not on file    Gets together: Not on file    Attends religious service: Not on file    Active member of club or organization: Not on file    Attends meetings of clubs or organizations: Not on file    Relationship status: Not on file  Other Topics Concern  . Not on file  Social History Narrative   Exercise: runs, weights    Family History  Problem Relation Age of Onset  . HIV Brother   . Heart attack Maternal Grandfather 65  . Heart murmur Mother   . Coronary artery disease Mother        MI @ 8784; CBAG @ 2978  . Prostate cancer Paternal Grandfather   . Alzheimer's disease Father   . Colonic polyp Father   . Heart attack Unknown        M uncles X 2; @ 70 & 82  . Fibromyalgia Sister   . Prostate cancer  Brother   . Diabetes Neg Hx   . Stroke Neg Hx     Review of Systems  Constitutional: Negative for chills and fever.  Eyes: Negative for visual disturbance.  Respiratory: Negative for cough, shortness of breath and wheezing.   Cardiovascular: Negative for chest pain, palpitations and leg swelling.  Gastrointestinal: Positive for abdominal pain. Negative for blood in stool, constipation, diarrhea and nausea.       No gerd  Genitourinary: Positive for difficulty urinating. Negative for dysuria and hematuria.  Musculoskeletal: Negative for arthralgias and back pain.  Skin: Negative for color change and rash.  Neurological: Negative for dizziness, light-headedness and headaches.  Psychiatric/Behavioral: Negative for dysphoric mood and sleep disturbance. The patient is not nervous/anxious.        Objective:   Vitals:   07/26/18 0832  BP: 122/78  Pulse: 77  Resp: 16  Temp: 98.5 F (36.9 C)  SpO2: 99%   Filed Weights   07/26/18 0832  Weight: 191 lb (86.6 kg)   Body mass index is 24.52 kg/m.  Wt Readings from Last 3 Encounters:  07/26/18 191 lb (86.6 kg)  05/11/18 190 lb 12.8 oz (86.5 kg)  10/20/17 189 lb 9.6 oz (86 kg)     Physical Exam Constitutional: He appears well-developed and well-nourished. No distress.  HENT:  Head: Normocephalic and atraumatic.  Right Ear: External ear normal.  Left Ear: External ear normal.  Mouth/Throat: Oropharynx is clear and moist.  Normal ear canals and TM b/l  Eyes: Conjunctivae and EOM are normal.  Neck: Neck supple. No tracheal deviation present. No thyromegaly present.  No carotid bruit  Cardiovascular: Normal rate, regular rhythm, normal heart sounds and intact distal pulses.   No murmur heard. Pulmonary/Chest: Effort normal and breath sounds normal. No respiratory distress. He has no wheezes. He has no rales.  Abdominal: Soft. He exhibits no distension. There is no tenderness.  Genitourinary: deferred to  urology Musculoskeletal: He exhibits no edema.  Lymphadenopathy:   He has no cervical adenopathy.  Skin: Skin is warm and dry. He is not diaphoretic.  Psychiatric: He has a normal mood and affect. His behavior is normal.         Assessment & Plan:   Physical exam: Screening blood work  ordered  Immunizations    Discussed shingrix, others up to date Colonoscopy   Up to date  Eye exams  Not up to date - advised him to schedule  EKG   Done 2013 Exercise   Regular - runs, weights, gym Weight   Normal BMI Skin   no concerns Substance abuse   none  See Problem List for Assessment and Plan of chronic medical problems.   Follow-up annually

## 2018-07-25 NOTE — Patient Instructions (Addendum)
Tests ordered today. Your results will be released to MyChart (or called to you) after review, usually within 72hours after test completion. If any changes need to be made, you will be notified at that same time.  All other Health Maintenance issues reviewed.   All recommended immunizations and age-appropriate screenings are up-to-date or discussed.  No immunizations administered today.   Medications reviewed and updated.  Changes include :   none    Please followup in one year    Health Maintenance, Male A healthy lifestyle and preventive care is important for your health and wellness. Ask your health care provider about what schedule of regular examinations is right for you. What should I know about weight and diet? Eat a Healthy Diet  Eat plenty of vegetables, fruits, whole grains, low-fat dairy products, and lean protein.  Do not eat a lot of foods high in solid fats, added sugars, or salt.  Maintain a Healthy Weight Regular exercise can help you achieve or maintain a healthy weight. You should:  Do at least 150 minutes of exercise each week. The exercise should increase your heart rate and make you sweat (moderate-intensity exercise).  Do strength-training exercises at least twice a week. Watch Your Levels of Cholesterol and Blood Lipids  Have your blood tested for lipids and cholesterol every 5 years starting at 60 years of age. If you are at high risk for heart disease, you should start having your blood tested when you are 60 years old. You may need to have your cholesterol levels checked more often if: ? Your lipid or cholesterol levels are high. ? You are older than 60 years of age. ? You are at high risk for heart disease. What should I know about cancer screening? Many types of cancers can be detected early and may often be prevented. Lung Cancer  You should be screened every year for lung cancer if: ? You are a current smoker who has smoked for at least 30  years. ? You are a former smoker who has quit within the past 15 years.  Talk to your health care provider about your screening options, when you should start screening, and how often you should be screened. Colorectal Cancer  Routine colorectal cancer screening usually begins at 60 years of age and should be repeated every 5-10 years until you are 60 years old. You may need to be screened more often if early forms of precancerous polyps or small growths are found. Your health care provider may recommend screening at an earlier age if you have risk factors for colon cancer.  Your health care provider may recommend using home test kits to check for hidden blood in the stool.  A small camera at the end of a tube can be used to examine your colon (sigmoidoscopy or colonoscopy). This checks for the earliest forms of colorectal cancer. Prostate and Testicular Cancer  Depending on your age and overall health, your health care provider may do certain tests to screen for prostate and testicular cancer.  Talk to your health care provider about any symptoms or concerns you have about testicular or prostate cancer. Skin Cancer  Check your skin from head to toe regularly.  Tell your health care provider about any new moles or changes in moles, especially if: ? There is a change in a mole's size, shape, or color. ? You have a mole that is larger than a pencil eraser.  Always use sunscreen. Apply sunscreen liberally and repeat throughout the   day.  Protect yourself by wearing long sleeves, pants, a wide-brimmed hat, and sunglasses when outside. What should I know about heart disease, diabetes, and high blood pressure?  If you are 18-39 years of age, have your blood pressure checked every 3-5 years. If you are 40 years of age or older, have your blood pressure checked every year. You should have your blood pressure measured twice-once when you are at a hospital or clinic, and once when you are not at a  hospital or clinic. Record the average of the two measurements. To check your blood pressure when you are not at a hospital or clinic, you can use: ? An automated blood pressure machine at a pharmacy. ? A home blood pressure monitor.  Talk to your health care provider about your target blood pressure.  If you are between 45-79 years old, ask your health care provider if you should take aspirin to prevent heart disease.  Have regular diabetes screenings by checking your fasting blood sugar level. ? If you are at a normal weight and have a low risk for diabetes, have this test once every three years after the age of 45. ? If you are overweight and have a high risk for diabetes, consider being tested at a younger age or more often.  A one-time screening for abdominal aortic aneurysm (AAA) by ultrasound is recommended for men aged 65-75 years who are current or former smokers. What should I know about preventing infection? Hepatitis B If you have a higher risk for hepatitis B, you should be screened for this virus. Talk with your health care provider to find out if you are at risk for hepatitis B infection. Hepatitis C Blood testing is recommended for:  Everyone born from 1945 through 1965.  Anyone with known risk factors for hepatitis C. Sexually Transmitted Diseases (STDs)  You should be screened each year for STDs including gonorrhea and chlamydia if: ? You are sexually active and are younger than 60 years of age. ? You are older than 60 years of age and your health care provider tells you that you are at risk for this type of infection. ? Your sexual activity has changed since you were last screened and you are at an increased risk for chlamydia or gonorrhea. Ask your health care provider if you are at risk.  Talk with your health care provider about whether you are at high risk of being infected with HIV. Your health care provider may recommend a prescription medicine to help prevent  HIV infection. What else can I do?  Schedule regular health, dental, and eye exams.  Stay current with your vaccines (immunizations).  Do not use any tobacco products, such as cigarettes, chewing tobacco, and e-cigarettes. If you need help quitting, ask your health care provider.  Limit alcohol intake to no more than 2 drinks per day. One drink equals 12 ounces of beer, 5 ounces of wine, or 1 ounces of hard liquor.  Do not use street drugs.  Do not share needles.  Ask your health care provider for help if you need support or information about quitting drugs.  Tell your health care provider if you often feel depressed.  Tell your health care provider if you have ever been abused or do not feel safe at home. This information is not intended to replace advice given to you by your health care provider. Make sure you discuss any questions you have with your health care provider. Document Released: 12/24/2007 Document Revised:   02/24/2016 Document Reviewed: 03/31/2015 Elsevier Interactive Patient Education  2019 [employer]vier Inc.  

## 2018-07-26 ENCOUNTER — Encounter: Payer: Self-pay | Admitting: Internal Medicine

## 2018-07-26 ENCOUNTER — Other Ambulatory Visit (INDEPENDENT_AMBULATORY_CARE_PROVIDER_SITE_OTHER): Payer: 59

## 2018-07-26 ENCOUNTER — Ambulatory Visit (INDEPENDENT_AMBULATORY_CARE_PROVIDER_SITE_OTHER): Payer: 59 | Admitting: Internal Medicine

## 2018-07-26 VITALS — BP 122/78 | HR 77 | Temp 98.5°F | Resp 16 | Ht 74.0 in | Wt 191.0 lb

## 2018-07-26 DIAGNOSIS — Z0000 Encounter for general adult medical examination without abnormal findings: Secondary | ICD-10-CM | POA: Diagnosis not present

## 2018-07-26 DIAGNOSIS — N400 Enlarged prostate without lower urinary tract symptoms: Secondary | ICD-10-CM | POA: Diagnosis not present

## 2018-07-26 DIAGNOSIS — E7849 Other hyperlipidemia: Secondary | ICD-10-CM

## 2018-07-26 DIAGNOSIS — R109 Unspecified abdominal pain: Secondary | ICD-10-CM | POA: Diagnosis not present

## 2018-07-26 LAB — CBC WITH DIFFERENTIAL/PLATELET
Basophils Absolute: 0 10*3/uL (ref 0.0–0.1)
Basophils Relative: 0.9 % (ref 0.0–3.0)
EOS PCT: 1.9 % (ref 0.0–5.0)
Eosinophils Absolute: 0.1 10*3/uL (ref 0.0–0.7)
HCT: 46.2 % (ref 39.0–52.0)
HEMOGLOBIN: 16.3 g/dL (ref 13.0–17.0)
LYMPHS ABS: 1.3 10*3/uL (ref 0.7–4.0)
Lymphocytes Relative: 26.6 % (ref 12.0–46.0)
MCHC: 35.3 g/dL (ref 30.0–36.0)
MCV: 92.2 fl (ref 78.0–100.0)
MONOS PCT: 10 % (ref 3.0–12.0)
Monocytes Absolute: 0.5 10*3/uL (ref 0.1–1.0)
NEUTROS PCT: 60.6 % (ref 43.0–77.0)
Neutro Abs: 3 10*3/uL (ref 1.4–7.7)
Platelets: 267 10*3/uL (ref 150.0–400.0)
RBC: 5.01 Mil/uL (ref 4.22–5.81)
RDW: 12.6 % (ref 11.5–15.5)
WBC: 4.9 10*3/uL (ref 4.0–10.5)

## 2018-07-26 LAB — COMPREHENSIVE METABOLIC PANEL
ALK PHOS: 77 U/L (ref 39–117)
ALT: 18 U/L (ref 0–53)
AST: 22 U/L (ref 0–37)
Albumin: 4.2 g/dL (ref 3.5–5.2)
BILIRUBIN TOTAL: 1.1 mg/dL (ref 0.2–1.2)
BUN: 16 mg/dL (ref 6–23)
CO2: 26 mEq/L (ref 19–32)
Calcium: 9.3 mg/dL (ref 8.4–10.5)
Chloride: 106 mEq/L (ref 96–112)
Creatinine, Ser: 1.04 mg/dL (ref 0.40–1.50)
GFR: 77.53 mL/min (ref >60.00)
Glucose, Bld: 98 mg/dL (ref 70–99)
POTASSIUM: 4.7 meq/L (ref 3.5–5.1)
Sodium: 139 mEq/L (ref 135–145)
TOTAL PROTEIN: 6.4 g/dL (ref 6.0–8.3)

## 2018-07-26 LAB — LIPID PANEL
CHOLESTEROL: 183 mg/dL (ref 0–200)
HDL: 67.2 mg/dL (ref >39.00)
LDL Cholesterol: 105 mg/dL — ABNORMAL HIGH (ref 0–99)
NONHDL: 115.72
TRIGLYCERIDES: 54 mg/dL (ref 0.0–149.0)
Total CHOL/HDL Ratio: 3
VLDL: 10.8 mg/dL (ref 0.0–40.0)

## 2018-07-26 LAB — TSH: TSH: 2.29 u[IU]/mL (ref 0.35–4.50)

## 2018-07-26 NOTE — Assessment & Plan Note (Signed)
Sees urology annually °

## 2018-07-26 NOTE — Assessment & Plan Note (Signed)
He continues to experience intermittent central-right middle abdominal pain.  Imaging last year was unremarkable and revealed no cause Describes it as a burning type pain at times it is worse with food, but an acids have not helped and actually made it worse one time Stress may make it worse Bowel movement is normal Not sensitive to touch ?  IBS ?  Neuropathy Discussed possible referral to GI-he deferred now, but will let me know if he wants to be seen

## 2018-07-26 NOTE — Assessment & Plan Note (Signed)
Controlled with diet 

## 2019-04-18 NOTE — Progress Notes (Signed)
Subjective:    Patient ID: Aaron Cherry, male    DOB: February 14, 1961, 58 y.o.   MRN: [id number]  HPI The patient is here for an acute visit.  He is exercising regularly - walks about 5 miles, rides his bike and does weights.     Abdominal pain:  He has had pain for > 1year.  He had his GB removed 10/2017 because the pain was thought to be related that - biliary dyskinesia.  The pain got a better for a while, but recurred.  The pain is in the RUQ.  He knows lactose and gluten are not his friend and he has decreased those.  Eating high fiber would also cause it, such as granola or certain high-fiber cereals.    His symptoms often start one hour after he eats. He gets pain in his RUQ.  He gets bloated.  He pushes on the RUQ and he can feel gas move and he will pass gas and the pain often goes away, but not always.  The main time he notices it is when he eats and then sits down.  He will sometimes feel a little nauseous and hot.    He denies GERD.  His bowels vary.  He denies blood in his stool or black stool.  He started taking a probiotic less than a week ago and thinks it may have helped a little.  Medications and allergies reviewed with patient and updated if appropriate.  Patient Active Problem List   Diagnosis Date Noted  . BPH (benign prostatic hyperplasia) 07/26/2018  . Abdominal pain 05/11/2018  . Biliary colic 02/58/5277  . Sciatica of left side 01/10/2017  . Hyperglycemia 06/16/2016  . DIVERTICULOSIS, COLON 03/18/2009  . History of colonic polyps 03/18/2009  . Hyperlipidemia 01/23/2009  . Sarcoidosis ([place]) 11/26/2007    No current outpatient medications on file prior to visit.   No current facility-administered medications on file prior to visit.     Past Medical History:  Diagnosis Date  . Diverticulosis   . Hyperlipidemia   . Keratoconjunctivitis  sicca   . Sarcoidosis 1985   PMH of    Past Surgical History:  Procedure Laterality Date  . APPENDECTOMY    .  CHOLECYSTECTOMY N/A 10/20/2017   Procedure: LAPAROSCOPIC CHOLECYSTECTOMY;  Surgeon: Clovis Riley, MD;  Location: [facility];  Service: General;  Laterality: N/A;  . COLONOSCOPY  2015   negative  . COLONOSCOPY W/ POLYPECTOMY  03/2009   [facility]; due 2015  . INGUINAL HERNIA REPAIR     Bilateral   . Neck Lumpectomy  age 1   benign  . POLYPECTOMY    . trans bronchial biopsy     [place] [facility]; sarcoidosis  . WRIST FRACTURE SURGERY      Social History   Socioeconomic History  . Marital status: Single    Spouse name: Not on file  . Number of children: Not on file  . Years of education: Not on file  . Highest education level: Not on file  Occupational History  . Not on file  Social Needs  . Financial resource strain: Not on file  . Food insecurity    Worry: Not on file    Inability: Not on file  . Transportation needs    Medical: Not on file    Non-medical: Not on file  Tobacco Use  . Smoking status: Former Smoker    Quit date: 07/11/1988    Years since quitting: 30.7  .  Smokeless tobacco: Never Used  . Tobacco comment: smoked 1976- 1990 , up to 1 ppWEEK  Substance and Sexual Activity  . Alcohol use: Yes    Alcohol/week: 14.0 - 21.0 standard drinks    Types: 14 - 21 Cans of beer per week    Comment: (2-3 beers per day)  . Drug use: No  . Sexual activity: Not on file  Lifestyle  . Physical activity    Days per week: Not on file    Minutes per session: Not on file  . Stress: Not on file  Relationships  . Social Musician on phone: Not on file    Gets together: Not on file    Attends religious service: Not on file    Active member of club or organization: Not on file    Attends meetings of clubs or organizations: Not on file    Relationship status: Not on file  Other Topics Concern  . Not on file  Social History Narrative   Exercise: runs, weights    Family History  Problem Relation Age of Onset  . HIV Brother   . Heart attack Maternal Grandfather 65   . Heart murmur Mother   . Coronary artery disease Mother        MI @ 73; CBAG @ 75  . Prostate cancer Paternal Grandfather   . Alzheimer's disease Father   . Colonic polyp Father   . Heart attack Unknown        M uncles X 2; @ 70 & 82  . Fibromyalgia Sister   . Prostate cancer Brother   . Diabetes Neg Hx   . Stroke Neg Hx     Review of Systems  Constitutional: Negative for chills and fever.  Gastrointestinal: Positive for abdominal pain and nausea. Negative for blood in stool, constipation, diarrhea and vomiting.       No gerd       Objective:   Vitals:   04/19/19 0808  BP: 140/78  Pulse: 85  Resp: 16  Temp: 99.1 F (37.3 C)  SpO2: 99%   BP Readings from Last 3 Encounters:  04/19/19 140/78  07/26/18 122/78  05/11/18 124/78   Wt Readings from Last 3 Encounters:  04/19/19 192 lb 12.8 oz (87.5 kg)  07/26/18 191 lb (86.6 kg)  05/11/18 190 lb 12.8 oz (86.5 kg)   Body mass index is 24.75 kg/m.   Physical Exam Constitutional:      General: He is not in acute distress.    Appearance: Normal appearance. He is not ill-appearing.  HENT:     Head: Normocephalic and atraumatic.  Abdominal:     General: Abdomen is flat. There is no distension.     Palpations: Abdomen is soft. There is no mass.     Tenderness: There is abdominal tenderness (minimal RUQ ). There is no guarding or rebound.     Hernia: No hernia is present.  Skin:    General: Skin is warm and dry.  Neurological:     Mental Status: He is alert.            Assessment & Plan:    See Problem List for Assessment and Plan of chronic medical problems.

## 2019-04-19 ENCOUNTER — Ambulatory Visit (INDEPENDENT_AMBULATORY_CARE_PROVIDER_SITE_OTHER): Payer: 59 | Admitting: Internal Medicine

## 2019-04-19 ENCOUNTER — Encounter: Payer: Self-pay | Admitting: Internal Medicine

## 2019-04-19 ENCOUNTER — Other Ambulatory Visit: Payer: Self-pay

## 2019-04-19 VITALS — BP 140/78 | HR 85 | Temp 99.1°F | Resp 16 | Ht 74.0 in | Wt 192.8 lb

## 2019-04-19 DIAGNOSIS — R109 Unspecified abdominal pain: Secondary | ICD-10-CM | POA: Diagnosis not present

## 2019-04-19 DIAGNOSIS — Z23 Encounter for immunization: Secondary | ICD-10-CM

## 2019-04-19 DIAGNOSIS — R1011 Right upper quadrant pain: Secondary | ICD-10-CM | POA: Diagnosis not present

## 2019-04-19 NOTE — Assessment & Plan Note (Signed)
Intermittent right upper quadrant pain Some foods seem to be related Possibly trapped gas Possibly an element of IBS Continue probiotic Will refer to GI for further evaluation

## 2019-04-19 NOTE — Patient Instructions (Signed)
  Flu immunization administered today.    Medications reviewed and updated.  Changes include : none     A referral was ordered for GI

## 2019-05-09 ENCOUNTER — Telehealth: Payer: Self-pay | Admitting: Internal Medicine

## 2019-05-09 NOTE — Telephone Encounter (Signed)
Has he talked to GI about seeing another provider in the office - one of the PA's may be able to see him sooner.     We can try starting a medication for his stomach to see if it helps.

## 2019-05-09 NOTE — Telephone Encounter (Signed)
Anything further he should do?

## 2019-05-09 NOTE — Telephone Encounter (Signed)
Pt is calling to see if Dr. Quay Burow can get him into to see another provider sooner than November 11th for his stomach issues that are getting progressively worse / Pt is not able to get into to see Dr. Ardis Hughs soon enough / Pt would like advice asap / please advise

## 2019-05-10 MED ORDER — SERTRALINE HCL 50 MG PO TABS: 50.0000 mg | ORAL_TABLET | Freq: Every day | ORAL | 3 refills | Status: DC

## 2019-05-10 MED ORDER — MELOXICAM 15 MG PO TABS: 15.0000 mg | ORAL_TABLET | Freq: Every day | ORAL | 0 refills | Status: DC

## 2019-05-10 MED ORDER — SACCHAROMYCES BOULARDII 250 MG PO CAPS: 250.0000 mg | ORAL_CAPSULE | Freq: Two times a day (BID) | ORAL | 5 refills | Status: DC

## 2019-05-10 NOTE — Telephone Encounter (Signed)
Sent to [facility], but not sure if it would be covered.

## 2019-05-10 NOTE — Telephone Encounter (Signed)
Spoke with pt and he stated that he had taken an old prescription for meloxicam which helped him sleep the best that he has in a long time and helped with pain a lot. Would like to see if he can get a rx for this or if this is something that is ok to take. He also asked if he can get something for anxiety due to worrying so much about what is going on. He is up for whatever recommendations you have. His pain has gotten worse over time.

## 2019-05-10 NOTE — Telephone Encounter (Signed)
Pt aware of response. Rxs sent in. Wants to also know if a good probiotic could be sent as an rx?

## 2019-05-10 NOTE — Telephone Encounter (Signed)
LVM letting pt know response. 

## 2019-05-10 NOTE — Addendum Note (Signed)
Addended by: Binnie Rail on: 05/10/2019 12:05 PM   Modules accepted: Orders

## 2019-05-10 NOTE — Addendum Note (Signed)
Addended by: Binnie Rail on: 05/10/2019 12:58 PM   Modules accepted: Orders

## 2019-05-10 NOTE — Telephone Encounter (Signed)
If meloxicam helps it points to a musculoskeletal cause for the pain -- my only concern with taking that is it is bad for the stomach.  The pain could be coming from the middle back.  We can do a trial of meloxicam, but again that would indicate a different cause.  For the anxiety is he ok with a daily medication  - if yes - both pending

## 2019-05-10 NOTE — Telephone Encounter (Signed)
Pt aware.

## 2019-05-10 NOTE — Addendum Note (Signed)
Addended by: Delice Bison E on: 05/10/2019 12:51 PM   Modules accepted: Orders

## 2019-05-13 ENCOUNTER — Other Ambulatory Visit: Payer: Self-pay

## 2019-05-13 ENCOUNTER — Telehealth: Payer: Self-pay

## 2019-05-13 MED ORDER — OMEPRAZOLE 40 MG PO CPDR: 40.0000 mg | DELAYED_RELEASE_CAPSULE | Freq: Every day | ORAL | 3 refills | Status: DC

## 2019-05-13 MED ORDER — DICYCLOMINE HCL 10 MG PO CAPS: 10.0000 mg | ORAL_CAPSULE | Freq: Three times a day (TID) | ORAL | 0 refills | Status: DC

## 2019-05-13 NOTE — Telephone Encounter (Signed)
Stop meloxicam.   Start omeprazole 40 mg daily and Bentyl  - pending.

## 2019-05-13 NOTE — Telephone Encounter (Signed)
Copied from [place] [id number]. Topic: General - Other >> May 13, 2019  8:06 AM Celene Kras A wrote: Reason for CRM: Pt called stating he is needing to speak with PCP or nurse regarding the problem he has been having. Pt states the medication he was given is not helping as the situation is getting worse. Pt would not disclose details, but he states he is needing a call back as soon as possible so he didn't have to go to the emergency room. Please advise.

## 2019-05-13 NOTE — Telephone Encounter (Signed)
Rx sent. Pt aware. Has an appointment tomorrow morning with GI.

## 2019-05-13 NOTE — Telephone Encounter (Signed)
Copied from CRM #[id number]. Topic: General - Other >> May 13, 2019  8:06 AM Williams, Olivia A wrote: Reason for CRM: Pt called stating he is needing to speak with PCP or nurse regarding the problem he has been having. Pt states the medication he was given is not helping as the situation is getting worse. Pt would not disclose details, but he states he is needing a call back as soon as possible so he didn't have to go to the emergency room. Please advise. 

## 2019-05-13 NOTE — Telephone Encounter (Signed)
See other note

## 2019-05-14 ENCOUNTER — Encounter: Payer: Self-pay | Admitting: Physician Assistant

## 2019-05-14 ENCOUNTER — Ambulatory Visit: Payer: 59 | Admitting: Physician Assistant

## 2019-05-14 VITALS — BP 120/70 | HR 92 | Temp 97.9°F | Ht 74.0 in | Wt 181.0 lb

## 2019-05-14 DIAGNOSIS — R634 Abnormal weight loss: Secondary | ICD-10-CM | POA: Diagnosis not present

## 2019-05-14 DIAGNOSIS — Z1159 Encounter for screening for other viral diseases: Secondary | ICD-10-CM

## 2019-05-14 DIAGNOSIS — R1013 Epigastric pain: Secondary | ICD-10-CM

## 2019-05-14 MED ORDER — OMEPRAZOLE 40 MG PO CPDR: 40.0000 mg | DELAYED_RELEASE_CAPSULE | Freq: Every day | ORAL | 3 refills | Status: DC

## 2019-05-14 NOTE — Progress Notes (Signed)
Chief Complaint: Right upper quadrant pain, weight loss  HPI:    [name] is a 58 year old male with a past medical history as listed below, known to Dr. Christella Hartigan, who was referred to me by Pincus Sanes, MD for a complaint of right upper quadrant pain and weight loss.      05/30/2014 colonoscopy with moderate diverticulosis noted in the left colon and otherwise normal.  Repeat was recommended in 10 years.    08/18/2017 right upper quadrant ultrasound is normal.  08/21/2017 abnormal HIDA scan.  10/20/2017 patient had a cholecystectomy.    Today, the patient presents to clinic and explains that 2 years ago he was [place] a [place] and got food poisoning and his sytem was "cleaned out" in April/May, from that point on, felt a right upper quadrant pain.  He eventually had his gallbladder out in February 2019 as above, continued with abdominal pain, PCP thought this may be IBS, he stopped dairy as well as some gluten as he thought this may be related and felt some better, but continued with this pain.  Saw his PCP in January and all of his normal labs were okay.  Then over this past summer everything got worse.      Currently, has a dull pain rated as a 6/10 at its worst mostly in his epigastrium/right upper quadrant which is worse at night.  This starts about 45 minutes after eating, sometimes he feels as though he can push on his abdomen and make gas and pain go away.  He has also had a weight loss of around 4 to 5 pounds.  This past Friday he had what sounds like a panic attack waking up 2:00 in the morning sweaty with a racing heart, he went to the urgent care and had all normal work-up.  He had just taken an anxiety pill given by his PCP and feels like this may be related.  He was able to eat yesterday.  Also restarted his Prilosec 40 mg daily yesterday and had a pretty good day.  Patient is worried about his symptoms and this is increasing his anxiety which he thinks also worsens his symptoms.  Patient is retired and tells me he has too much time to think about things.    Denies fever, chills, change in bowel habits, blood in his stool or vomiting.  Past Medical History:  Diagnosis Date  . Diverticulosis   . Hyperlipidemia   . Keratoconjunctivitis  sicca   . Sarcoidosis 1985   PMH of    Past Surgical History:  Procedure Laterality Date  . APPENDECTOMY    . CHOLECYSTECTOMY N/A 10/20/2017   Procedure: LAPAROSCOPIC CHOLECYSTECTOMY;  Surgeon: Berna Bue, MD;  Location: [facility] OR;  Service: General;  Laterality: N/A;  . COLONOSCOPY  2015   negative  . COLONOSCOPY W/ POLYPECTOMY  03/2009   [facility]; due 2015  . INGUINAL HERNIA REPAIR     Bilateral   . Neck Lumpectomy  age 52   benign  . POLYPECTOMY    . trans bronchial biopsy     [place] 80's; sarcoidosis  . WRIST FRACTURE SURGERY      Current Outpatient Medications  Medication Sig Dispense Refill  . dicyclomine (BENTYL) 10 MG capsule Take 1 capsule (10 mg total) by mouth 4 (four) times daily -  before meals and at bedtime. 90 capsule 0  . omeprazole (PRILOSEC) 40 MG capsule Take 1 capsule (40 mg total) by mouth daily. Take 30  minutes prior to a meal 30 capsule 3  . saccharomyces boulardii (FLORASTOR) 250 MG capsule Take 1 capsule (250 mg total) by mouth 2 (two) times daily. 60 capsule 5  . sertraline (ZOLOFT) 50 MG tablet Take 1 tablet (50 mg total) by mouth at bedtime. 30 tablet 3   No current facility-administered medications for this visit.     Allergies as of 05/14/2019  . (No Known Allergies)    Family History  Problem Relation Age of Onset  . HIV Brother   . Heart attack Maternal Grandfather 65  . Heart murmur Mother   . Coronary artery disease Mother        MI @ 63; CBAG @ 4  . Prostate cancer Paternal Grandfather   . Alzheimer's disease Father   . Colonic polyp Father   . Heart attack Unknown        M uncles X 2; @ 38 & 66  . Fibromyalgia Sister   . Prostate cancer Brother   . Diabetes Neg Hx    . Stroke Neg Hx     Social History   Socioeconomic History  . Marital status: Single    Spouse name: Not on file  . Number of children: Not on file  . Years of education: Not on file  . Highest education level: Not on file  Occupational History  . Not on file  Social Needs  . Financial resource strain: Not on file  . Food insecurity    Worry: Not on file    Inability: Not on file  . Transportation needs    Medical: Not on file    Non-medical: Not on file  Tobacco Use  . Smoking status: Former Smoker    Quit date: 07/11/1988    Years since quitting: 30.8  . Smokeless tobacco: Never Used  . Tobacco comment: smoked [place] , up to 1 ppWEEK  Substance and Sexual Activity  . Alcohol use: Yes    Alcohol/week: 14.0 - 21.0 standard drinks    Types: 14 - 21 Cans of beer per week    Comment: (2-3 beers per day)  . Drug use: No  . Sexual activity: Not on file  Lifestyle  . Physical activity    Days per week: Not on file    Minutes per session: Not on file  . Stress: Not on file  Relationships  . Social Herbalist on phone: Not on file    Gets together: Not on file    Attends religious service: Not on file    Active member of club or organization: Not on file    Attends meetings of clubs or organizations: Not on file    Relationship status: Not on file  . Intimate partner violence    Fear of current or ex partner: Not on file    Emotionally abused: Not on file    Physically abused: Not on file    Forced sexual activity: Not on file  Other Topics Concern  . Not on file  Social History Narrative   Exercise: runs, weights    Review of Systems:    Constitutional: No fever or chills Skin: No rash  Cardiovascular: No chest pain Respiratory: No SOB  Gastrointestinal: See HPI and otherwise negative Genitourinary: No dysuria Neurological: No headache Musculoskeletal: No new muscle or joint pain Hematologic: No bleeding  Psychiatric: No history of depression  or anxiety   Physical Exam:  Vital signs: BP 120/70   Pulse 92  Temp 97.9 F (36.6 C)   Ht 6\' 2"  (1.88 m)   Wt 181 lb (82.1 kg)   BMI 23.24 kg/m   Constitutional:   Pleasant Caucasian male appears to be in NAD, Well developed, Well nourished, alert and cooperative Head:  Normocephalic and atraumatic. Eyes:   PEERL, EOMI. No icterus. Conjunctiva pink. Ears:  Normal auditory acuity. Neck:  Supple Throat: Oral cavity and pharynx without inflammation, swelling or lesion.  Respiratory: Respirations even and unlabored. Lungs clear to auscultation bilaterally.   No wheezes, crackles, or rhonchi.  Cardiovascular: Normal S1, S2. No MRG. Regular rate and rhythm. No peripheral edema, cyanosis or pallor.  Gastrointestinal:  Soft, nondistended, Mild epigastric ttp. No rebound or guarding. Normal bowel sounds. No appreciable masses or hepatomegaly. Rectal:  Not performed.  Msk:  Symmetrical without gross deformities. Without edema, no deformity or joint abnormality.  Neurologic:  Alert and  oriented x4;  grossly normal neurologically.  Skin:   Dry and intact without significant lesions or rashes. Psychiatric:  Demonstrates good judgement and reason without abnormal affect or behaviors.  MOST RECENT LABS AND IMAGING: CBC    Component Value Date/Time   WBC 4.9 07/26/2018 0920   RBC 5.01 07/26/2018 0920   HGB 16.3 07/26/2018 0920   HCT 46.2 07/26/2018 0920   PLT 267.0 07/26/2018 0920   MCV 92.2 07/26/2018 0920   MCH 31.4 10/13/2017 0812   MCHC 35.3 07/26/2018 0920   RDW 12.6 07/26/2018 0920   LYMPHSABS 1.3 07/26/2018 0920   MONOABS 0.5 07/26/2018 0920   EOSABS 0.1 07/26/2018 0920   BASOSABS 0.0 07/26/2018 0920    CMP     Component Value Date/Time   NA 139 07/26/2018 0920   K 4.7 07/26/2018 0920   CL 106 07/26/2018 0920   CO2 26 07/26/2018 0920   GLUCOSE 98 07/26/2018 0920   GLUCOSE 98 07/10/2006 1118   BUN 16 07/26/2018 0920   CREATININE 1.04 07/26/2018 0920   CALCIUM 9.3  07/26/2018 0920   PROT 6.4 07/26/2018 0920   ALBUMIN 4.2 07/26/2018 0920   AST 22 07/26/2018 0920   ALT 18 07/26/2018 0920   ALKPHOS 77 07/26/2018 0920   BILITOT 1.1 07/26/2018 0920   GFRNONAA 105.22 02/02/2010 0000   Assessment: 1. Epigastric pain: For the past 2 years, initially thought to be gallbladder, cholecystectomy in January 2019, pain continued, some better with Nexium; consider gastritis plus/minus IBS 2. Weight Loss: About 8 pounds in the past couple of months, 4-5 pounds over the weekend; likely related to epigastric pain and anxiety  Plan: 1.  Scheduled patient for an EGD in the [facility] with Dr. Christella HartiganJacobs.  Did discuss risks, benefits, limitations and alternatives and the patient agrees to proceed. 2.  Continue Nexium 40 mg daily. 3.  Discussed antireflux diet and lifestyle modifications. 4.  If above is unrevealing and patient continues with symptoms and/or weight loss, may need a colonoscopy.  Did discuss this briefly today. 5.  Patient to follow in clinic per recommendations from Dr. Christella HartiganJacobs after time of procedure.  Hyacinth MeekerJennifer Ettel Albergo, PA-C [facility] 05/14/2019, 9:39 AM  Cc: Pincus SanesBurns, Stacy J, MD

## 2019-05-14 NOTE — Patient Instructions (Signed)
If you are age 60 or older, your body mass index should be between 23-30. Your Body mass index is 23.24 kg/m. If this is out of the aforementioned range listed, please consider follow up with your Primary Care Provider.  If you are age 39 or younger, your body mass index should be between 19-25. Your Body mass index is 23.24 kg/m. If this is out of the aformentioned range listed, please consider follow up with your Primary Care Provider.   We have sent the following medications to your pharmacy for you to pick up at your convenience:  Continue taking omeprazole 40mg , we have sent a new prescription to your pharmacy  Thank you for choosing me and [facility]  Ellouise Newer, PA-C

## 2019-05-14 NOTE — Progress Notes (Signed)
I agree with the above note, plan 

## 2019-05-15 ENCOUNTER — Encounter: Payer: Self-pay | Admitting: Internal Medicine

## 2019-05-15 DIAGNOSIS — R002 Palpitations: Secondary | ICD-10-CM

## 2019-05-16 ENCOUNTER — Encounter: Payer: Self-pay | Admitting: Internal Medicine

## 2019-05-16 ENCOUNTER — Other Ambulatory Visit (INDEPENDENT_AMBULATORY_CARE_PROVIDER_SITE_OTHER): Payer: 59

## 2019-05-16 DIAGNOSIS — R002 Palpitations: Secondary | ICD-10-CM | POA: Diagnosis not present

## 2019-05-16 LAB — COMPREHENSIVE METABOLIC PANEL
ALT: 17 U/L (ref 0–53)
AST: 17 U/L (ref 0–37)
Albumin: 4.4 g/dL (ref 3.5–5.2)
Alkaline Phosphatase: 80 U/L (ref 39–117)
BUN: 15 mg/dL (ref 6–23)
CO2: 22 mEq/L (ref 19–32)
Calcium: 9.5 mg/dL (ref 8.4–10.5)
Chloride: 103 mEq/L (ref 96–112)
Creatinine, Ser: 0.91 mg/dL (ref 0.40–1.50)
GFR: 84.87 mL/min (ref >60.00)
Glucose, Bld: 135 mg/dL — ABNORMAL HIGH (ref 70–99)
Potassium: 3.7 mEq/L (ref 3.5–5.1)
Sodium: 136 mEq/L (ref 135–145)
Total Bilirubin: 0.7 mg/dL (ref 0.2–1.2)
Total Protein: 6.7 g/dL (ref 6.0–8.3)

## 2019-05-16 LAB — CBC WITH DIFFERENTIAL/PLATELET
Basophils Absolute: 0 10*3/uL (ref 0.0–0.1)
Basophils Relative: 0.6 % (ref 0.0–3.0)
Eosinophils Absolute: 0 10*3/uL (ref 0.0–0.7)
Eosinophils Relative: 0.4 % (ref 0.0–5.0)
HCT: 47.1 % (ref 39.0–52.0)
Hemoglobin: 16.3 g/dL (ref 13.0–17.0)
Lymphocytes Relative: 13.5 % (ref 12.0–46.0)
Lymphs Abs: 1.1 10*3/uL (ref 0.7–4.0)
MCHC: 34.5 g/dL (ref 30.0–36.0)
MCV: 93.7 fl (ref 78.0–100.0)
Monocytes Absolute: 0.5 10*3/uL (ref 0.1–1.0)
Monocytes Relative: 6.3 % (ref 3.0–12.0)
Neutro Abs: 6.4 10*3/uL (ref 1.4–7.7)
Neutrophils Relative %: 79.2 % — ABNORMAL HIGH (ref 43.0–77.0)
Platelets: 321 10*3/uL (ref 150.0–400.0)
RBC: 5.03 Mil/uL (ref 4.22–5.81)
RDW: 12.4 % (ref 11.5–15.5)
WBC: 8.1 10*3/uL (ref 4.0–10.5)

## 2019-05-16 LAB — TSH: TSH: 2.01 u[IU]/mL (ref 0.35–4.50)

## 2019-05-17 ENCOUNTER — Encounter: Payer: Self-pay | Admitting: Gastroenterology

## 2019-05-17 LAB — SARS CORONAVIRUS 2 (TAT 6-24 HRS): SARS Coronavirus 2: NEGATIVE

## 2019-05-19 MED ORDER — LORAZEPAM 0.5 MG PO TABS: 0.5000 mg | ORAL_TABLET | Freq: Every evening | ORAL | 0 refills | Status: DC | PRN

## 2019-05-19 NOTE — Addendum Note (Signed)
Addended by: Binnie Rail on: 05/19/2019 01:37 PM   Modules accepted: Orders

## 2019-05-21 ENCOUNTER — Other Ambulatory Visit: Payer: Self-pay

## 2019-05-21 ENCOUNTER — Encounter: Payer: Self-pay | Admitting: Gastroenterology

## 2019-05-21 ENCOUNTER — Ambulatory Visit (AMBULATORY_SURGERY_CENTER): Payer: 59 | Admitting: Gastroenterology

## 2019-05-21 VITALS — BP 118/78 | HR 83 | Temp 98.6°F | Resp 15 | Ht 74.0 in | Wt 181.0 lb

## 2019-05-21 DIAGNOSIS — K3189 Other diseases of stomach and duodenum: Secondary | ICD-10-CM

## 2019-05-21 DIAGNOSIS — K2950 Unspecified chronic gastritis without bleeding: Secondary | ICD-10-CM | POA: Diagnosis not present

## 2019-05-21 DIAGNOSIS — B3781 Candidal esophagitis: Secondary | ICD-10-CM | POA: Diagnosis not present

## 2019-05-21 DIAGNOSIS — R634 Abnormal weight loss: Secondary | ICD-10-CM

## 2019-05-21 DIAGNOSIS — B3749 Other urogenital candidiasis: Secondary | ICD-10-CM

## 2019-05-21 MED ORDER — FLUCONAZOLE 100 MG PO TABS: 100.0000 mg | ORAL_TABLET | Freq: Every day | ORAL | 0 refills | Status: DC

## 2019-05-21 MED ORDER — SODIUM CHLORIDE 0.9 % IV SOLN: 500.0000 mL | Freq: Once | INTRAVENOUS | Status: DC

## 2019-05-21 NOTE — Patient Instructions (Signed)
Handout given for Gastritis.  YOU HAD AN ENDOSCOPIC PROCEDURE TODAY AT THE [facility]:   Refer to the procedure report that was given to you for any specific questions about what was found during the examination.  If the procedure report does not answer your questions, please call your gastroenterologist to clarify.  If you requested that your care partner not be given the details of your procedure findings, then the procedure report has been included in a sealed envelope for you to review at your convenience later.  YOU SHOULD EXPECT: Some feelings of bloating in the abdomen. Passage of more gas than usual.  Walking can help get rid of the air that was put into your GI tract during the procedure and reduce the bloating. If you had a lower endoscopy (such as a colonoscopy or flexible sigmoidoscopy) you may notice spotting of blood in your stool or on the toilet paper. If you underwent a bowel prep for your procedure, you may not have a normal bowel movement for a few days.  Please Note:  You might notice some irritation and congestion in your nose or some drainage.  This is from the oxygen used during your procedure.  There is no need for concern and it should clear up in a day or so.  SYMPTOMS TO REPORT IMMEDIATELY:   Following upper endoscopy (EGD)  Vomiting of blood or coffee ground material  New chest pain or pain under the shoulder blades  Painful or persistently difficult swallowing  New shortness of breath  Fever of 100F or higher  Black, tarry-looking stools  For urgent or emergent issues, a gastroenterologist can be reached at any hour by calling [phone].   DIET:  We do recommend a small meal at first, but then you may proceed to your regular diet.  Drink plenty of fluids but you should avoid alcoholic beverages for 24 hours.  ACTIVITY:  You should plan to take it easy for the rest of today and you should NOT DRIVE or use heavy machinery until tomorrow (because  of the sedation medicines used during the test).    FOLLOW UP: Our staff will call the number listed on your records 48-72 hours following your procedure to check on you and address any questions or concerns that you may have regarding the information given to you following your procedure. If we do not reach you, we will leave a message.  We will attempt to reach you two times.  During this call, we will ask if you have developed any symptoms of COVID 19. If you develop any symptoms (ie: fever, flu-like symptoms, shortness of breath, cough etc.) before then, please call [phone].  If you test positive for Covid 19 in the 2 weeks post procedure, please call and report this information to us.    If any biopsies were taken you will be contacted by phone or by letter within the next 1-3 weeks.  Please call us at [phone] if you have not heard about the biopsies in 3 weeks.    SIGNATURES/CONFIDENTIALITY: You and/or your care partner have signed paperwork which will be entered into your electronic medical record.  These signatures attest to the fact that that the information above on your After Visit Summary has been reviewed and is understood.  Full responsibility of the confidentiality of this discharge information lies with you and/or your care-partner. 

## 2019-05-21 NOTE — Progress Notes (Signed)
Pt's states no medical or surgical changes since previsit or office visit.  JB - temp CW - vitals. 

## 2019-05-21 NOTE — Op Note (Signed)
[facility] Patient Name: Aaron PlumeMichael Cherry Procedure Date: 05/21/2019 1:57 PM MRN: [id number] Endoscopist: Rachael Feeaniel P Jacobs , MD Age: [age] Referring MD:  Date of Birth: Jul 08, 1961 Gender: Male Account #: [id number] Procedure:                Upper GI endoscopy Indications:              Abdominal pain in the right upper quadrant, weight                            loss; improved since staring PPI Medicines:                Monitored Anesthesia Care Procedure:                Pre-Anesthesia Assessment:                           - Prior to the procedure, a History and Physical                            was performed, and patient medications and                            allergies were reviewed. The patient's tolerance of                            previous anesthesia was also reviewed. The risks                            and benefits of the procedure and the sedation                            options and risks were discussed with the patient.                            All questions were answered, and informed consent                            was obtained. Prior Anticoagulants: The patient has                            taken no previous anticoagulant or antiplatelet                            agents. ASA Grade Assessment: II - A patient with                            mild systemic disease. After reviewing the risks                            and benefits, the patient was deemed in                            satisfactory condition to undergo the procedure.  After obtaining informed consent, the endoscope was                            passed under direct vision. Throughout the                            procedure, the patient's blood pressure, pulse, and                            oxygen saturations were monitored continuously. The                            Endoscope was introduced through the mouth, and                            advanced to the  second part of duodenum. The upper                            GI endoscopy was accomplished without difficulty.                            The patient tolerated the procedure well. Scope In: Scope Out: Findings:                 A few white exudate lesions in the distal                            esophagus, consistent with mild candida infection.                           Minimal inflammation characterized by erythema and                            granularity was found in the gastric antrum.                            Biopsies were taken with a cold forceps for                            histology.                           The exam was otherwise without abnormality. Complications:            No immediate complications. Estimated blood loss:                            None. Estimated Blood Loss:     Estimated blood loss: none. Impression:               - Gastritis. Biopsied to check for H. pylori                           - Candida esophagitis.                           -  The examination was otherwise normal. Recommendation:           - Patient has a contact number available for                            emergencies. The signs and symptoms of potential                            delayed complications were discussed with the                            patient. Return to normal activities tomorrow.                            Written discharge instructions were provided to the                            patient.                           - Resume previous diet.                           - Continue present medications. Will call in                            treatment for the mild esophageal yeast infection                            with diflucan 100mg  pills, one pill daily for 10                            days.                           - Await pathology results. , MD 05/21/2019 2:19:55 PM This report has been signed electronically.

## 2019-05-21 NOTE — Progress Notes (Signed)
Called to room to assist during endoscopic procedure.  Patient ID and intended procedure confirmed with present staff. Received instructions for my participation in the procedure from the performing physician.  

## 2019-05-22 ENCOUNTER — Ambulatory Visit: Payer: 59 | Admitting: Gastroenterology

## 2019-05-23 ENCOUNTER — Telehealth: Payer: Self-pay

## 2019-05-23 NOTE — Telephone Encounter (Signed)
  Follow up Call-  Call back number 05/21/2019  Post procedure Call Back phone  # [phone]  Permission to leave phone message Yes  Some recent data might be hidden     Patient questions:  Do you have a fever, pain , or abdominal swelling? No. Pain Score  0 *  Have you tolerated food without any problems? Yes.    Have you been able to return to your normal activities? Yes.    Do you have any questions about your discharge instructions: Diet   No. Medications  No. Follow up visit  No.  Do you have questions or concerns about your Care? No.  Actions: * If pain score is 4 or above: No action needed, pain <4.  1. Have you developed a fever since your procedure? no  2.   Have you had an respiratory symptoms (SOB or cough) since your procedure? no  3.   Have you tested positive for COVID 19 since your procedure no  4.   Have you had any family members/close contacts diagnosed with the COVID 19 since your procedure?  no   If yes to any of these questions please route to Joylene John, RN and Alphonsa Gin, Therapist, sports.

## 2019-05-27 ENCOUNTER — Encounter: Payer: Self-pay | Admitting: Internal Medicine

## 2019-05-27 ENCOUNTER — Encounter: Payer: Self-pay | Admitting: Gastroenterology

## 2019-06-04 ENCOUNTER — Telehealth: Payer: Self-pay | Admitting: Gastroenterology

## 2019-06-04 NOTE — Telephone Encounter (Signed)
The pt was advised to continue omeprazole 40 mg once daily.  He was also advised to keep his appt as scheduled and call if he develops symptoms prior to the appt

## 2019-07-02 ENCOUNTER — Other Ambulatory Visit: Payer: Self-pay | Admitting: Internal Medicine

## 2019-07-23 ENCOUNTER — Other Ambulatory Visit (INDEPENDENT_AMBULATORY_CARE_PROVIDER_SITE_OTHER): Payer: 59

## 2019-07-23 ENCOUNTER — Ambulatory Visit: Payer: 59 | Admitting: Gastroenterology

## 2019-07-23 ENCOUNTER — Encounter: Payer: Self-pay | Admitting: Gastroenterology

## 2019-07-23 VITALS — BP 106/60 | HR 84 | Temp 98.2°F | Ht 74.0 in | Wt 189.2 lb

## 2019-07-23 DIAGNOSIS — R109 Unspecified abdominal pain: Secondary | ICD-10-CM | POA: Diagnosis not present

## 2019-07-23 DIAGNOSIS — R634 Abnormal weight loss: Secondary | ICD-10-CM

## 2019-07-23 LAB — BUN: BUN: 17 mg/dL (ref 6–23)

## 2019-07-23 LAB — IGA: IgA: 211 mg/dL (ref 68–378)

## 2019-07-23 LAB — CREATININE, SERUM: Creatinine, Ser: 0.97 mg/dL (ref 0.40–1.50)

## 2019-07-23 MED ORDER — TRAZODONE HCL 50 MG PO TABS: 50.0000 mg | ORAL_TABLET | Freq: Every day | ORAL | 11 refills | Status: DC

## 2019-07-23 NOTE — Patient Instructions (Signed)
If you are age 61 or older, your body mass index should be between 23-30. Your Body mass index is 24.29 kg/m. If this is out of the aforementioned range listed, please consider follow up with your Primary Care Provider.  If you are age 58 or younger, your body mass index should be between 19-25. Your Body mass index is 24.29 kg/m. If this is out of the aformentioned range listed, please consider follow up with your Primary Care Provider.   You have been scheduled for a CT scan of the abdomen and pelvis at [facility].  You are scheduled on 07/29/19 at 2:30 pm. You should arrive 15 minutes prior to your appointment time for registration. Please follow the written instructions below on the day of your exam:  WARNING: IF YOU ARE ALLERGIC TO IODINE/X-RAY DYE, PLEASE NOTIFY RADIOLOGY IMMEDIATELY AT [phone]! YOU WILL BE GIVEN A 13 HOUR PREMEDICATION PREP.  1) Do not eat or drink anything after 10:30 am (4 hours prior to your test) 2) You have been given 2 bottles of oral contrast to drink. The solution may taste better if refrigerated, but do NOT add ice or any other liquid to this solution. Shake well before drinking.    Drink 1 bottle of contrast @ 12:30 pm (2 hours prior to your exam)  Drink 1 bottle of contrast @ 1:30 pm (1 hour prior to your exam)  You may take any medications as prescribed with a small amount of water, if necessary. If you take any of the following medications: METFORMIN, GLUCOPHAGE, GLUCOVANCE, AVANDAMET, RIOMET, FORTAMET, [place] MET, JANUMET, GLUMETZA or METAGLIP, you MAY be asked to HOLD this medication 48 hours AFTER the exam.  The purpose of you drinking the oral contrast is to aid in the visualization of your intestinal tract. The contrast solution may cause some diarrhea. Depending on your individual set of symptoms, you may also receive an intravenous injection of x-ray contrast/dye. Plan on being at [facility] for 30 minutes or longer, depending  on the type of exam you are having performed.  This test typically takes 30-45 minutes to complete.  If you have any questions regarding your exam or if you need to reschedule, you may call the CT department at [phone] between the hours of 8:00 am and 5:00 pm, Monday-Friday.  _______________________________________________________________________  Your provider has requested that you go to the basement level for lab work before leaving today. Press "B" on the elevator. The lab is located at the first door on the left as you exit the elevator.  We have sent the following medications to your pharmacy for you to pick up at your convenience: Trazodone  We will call you with results.  Thank you for choosing me and [facility].  Owens Loffler, MD  Due to recent changes in healthcare laws, you may see the results of your imaging and laboratory studies on MyChart before your provider has had a chance to review them.  We understand that in some cases there may be results that are confusing or concerning to you. Not all laboratory results come back in the same time frame and the provider may be waiting for multiple results in order to interpret others.  Please give [place] 48 hours in order for your provider to thoroughly review all the results before contacting the office for clarification of your results.

## 2019-07-23 NOTE — Progress Notes (Signed)
Review of pertinent gastrointestinal problems: 1.  Right upper quadrant pain, work-up 2019 with abdominal ultrasound was normal, 2019 HIDA scan was normal.  2019 he underwent a cholecystectomy.  EGD November 2020 Candida esophagitis and mild gastritis.  He was started on Diflucan 100 mg daily for 10 days.  Biopsies showed no sign of H. Pylori. 2.  Routine risk for colon cancer: colonoscopy November 2015 showed left-sided diverticulosis but was otherwise normal.  He was recommended to have repeat colon cancer screening with colonoscopy at 10-year interval  HPI: This is a very pleasant 59 year old man who I last saw the time of the EGD.  Feels results summarized above.  He continues to have abdominal discomforts.  Completely stopped caffeine and stop drinking alcohol for about 2 months.  He then had 3 beers on New Year's Eve and had a very upset stomach and loose stools afterwards.  He is having intermittent epigastric left-sided sometimes right-sided abdominal pains.  His bowels are intermittently loose.  His weight has stabilized, he is no longer losing weight.  He is very clear that stressful situations lead to GI symptoms.  He is also very clear that he has not been sleeping well.  He thinks he gets about 2 or 3 hours of sleep nightly.  He may be lactose intolerant and he has switched to drinking almond milk and that does not bother his stomach.  ROS: complete GI ROS as described in HPI, all other review negative.  Constitutional:  No unintentional weight loss   Past Medical History:  Diagnosis Date  . Diverticulosis   . Hyperlipidemia   . Keratoconjunctivitis  sicca   . Sarcoidosis 1985   PMH of    Past Surgical History:  Procedure Laterality Date  . APPENDECTOMY    . CHOLECYSTECTOMY N/A 10/20/2017   Procedure: LAPAROSCOPIC CHOLECYSTECTOMY;  Surgeon: Clovis Riley, MD;  Location: [facility];  Service: General;  Laterality: N/A;  . COLONOSCOPY  2015   negative  . COLONOSCOPY W/  POLYPECTOMY  03/2009   [facility]; due 2015  . INGUINAL HERNIA REPAIR     Bilateral   . Neck Lumpectomy  age 23   benign  . POLYPECTOMY    . trans bronchial biopsy     [place] 80's; sarcoidosis  . WRIST FRACTURE SURGERY Left     Current Outpatient Medications  Medication Sig Dispense Refill  . LORazepam (ATIVAN) 0.5 MG tablet Take 1 tablet (0.5 mg total) by mouth at bedtime as needed for sleep. 30 tablet 0  . omeprazole (PRILOSEC) 40 MG capsule Take 1 capsule (40 mg total) by mouth daily. Take 30 minutes prior to a meal 30 capsule 3   No current facility-administered medications for this visit.    Allergies as of 07/23/2019  . (No Known Allergies)    Family History  Problem Relation Age of Onset  . HIV Brother   . Colon cancer Brother   . Heart attack Maternal Grandfather 65  . Heart murmur Mother   . Coronary artery disease Mother        MI @ 32; CBAG @ 59  . Prostate cancer Paternal Grandfather   . Alzheimer's disease Father   . Colonic polyp Father   . Heart attack Other        M uncles X 2; @ 28 & 63  . Fibromyalgia Sister   . Prostate cancer Brother   . Colon cancer Paternal Grandmother   . Diabetes Neg Hx   . Stroke Neg  Hx   . Esophageal cancer Neg Hx   . Stomach cancer Neg Hx     Social History   Socioeconomic History  . Marital status: Single    Spouse name: Not on file  . Number of children: Not on file  . Years of education: Not on file  . Highest education level: Not on file  Occupational History  . Not on file  Tobacco Use  . Smoking status: Former Smoker    Quit date: 07/11/1988    Years since quitting: 31.0  . Smokeless tobacco: Never Used  . Tobacco comment: smoked [place] , up to 1 ppWEEK  Substance and Sexual Activity  . Alcohol use: Yes    Alcohol/week: 14.0 - 21.0 standard drinks    Types: 14 - 21 Cans of beer per week    Comment: (2-3 beers per day)  . Drug use: No  . Sexual activity: Not on file  Other Topics Concern  . Not on  file  Social History Narrative   Exercise: runs, weights   Social Determinants of Health   Financial Resource Strain:   . Difficulty of Paying Living Expenses: Not on file  Food Insecurity:   . Worried About Charity fundraiser in the Last Year: Not on file  . Ran Out of Food in the Last Year: Not on file  Transportation Needs:   . Lack of Transportation (Medical): Not on file  . Lack of Transportation (Non-Medical): Not on file  Physical Activity:   . Days of Exercise per Week: Not on file  . Minutes of Exercise per Session: Not on file  Stress:   . Feeling of Stress : Not on file  Social Connections:   . Frequency of Communication with Friends and Family: Not on file  . Frequency of Social Gatherings with Friends and Family: Not on file  . Attends Religious Services: Not on file  . Active Member of Clubs or Organizations: Not on file  . Attends Archivist Meetings: Not on file  . Marital Status: Not on file  Intimate Partner Violence:   . Fear of Current or Ex-Partner: Not on file  . Emotionally Abused: Not on file  . Physically Abused: Not on file  . Sexually Abused: Not on file     Physical Exam: BP 106/60   Pulse 84   Temp 98.2 F (36.8 C)   Ht '6\' 2"'  (1.88 m)   Wt 189 lb 3.2 oz (85.8 kg)   BMI 24.29 kg/m  Constitutional: generally well-appearing Psychiatric: alert and oriented x3 Abdomen: soft, nontender, nondistended, no obvious ascites, no peritoneal signs, normal bowel sounds No peripheral edema noted in lower extremities  Assessment and plan: 59 y.o. male with abdominal discomfort, previous weight loss, intermittent loose stools  I am struck by the relation to his symptoms and stressful situations.  He thinks that stress is clearly playing a role in his GI issues and I think he has probably correct.  He is also not been sleeping very well and that could cause even more problems with stress.  I recommended that we make sure we are not missing any  serious given his previous weight loss and his abdominal complaints with a CT scan abdomen pelvis with IV and oral contrast.  I also recommended we exclude celiac sprue as a etiology to his myriad upper and lower GI symptoms.  That will be with blood testing TTG and total IgA.  He is sleeping very poorly  and I am happy to call him in trazodone 50 mg pills 1 pill nightly on a nightly basis dispense 30 with 11 refills.  Please see the "Patient Instructions" section for addition details about the plan.  Owens Loffler, MD [facility] 07/23/2019, 9:54 AM

## 2019-07-24 LAB — TISSUE TRANSGLUTAMINASE, IGA: (tTG) Ab, IgA: 1 U/mL

## 2019-07-28 NOTE — Progress Notes (Signed)
Subjective:    Patient ID: Aaron Cherry, male    DOB: 05-31-1961, 59 y.o.   MRN: [id number]  HPI He is here for a physical exam.   His bowels are reactive to stress and certain foods.  He will get bloating, gas and sometimes diarrhea.  He is taking the omeprazole daily.  He denies GERD.   He is having increased stress.  It does affect his sleep.  If he takes 1/2 of an ativan it helps. The trazodone initially makes him anxious, then he sleep good, but then will feel terrible the next day - more depressed, groggy and feels like he has some memory loss.      Medications and allergies reviewed with patient and updated if appropriate.  Patient Active Problem List   Diagnosis Date Noted  . Anxiety 07/30/2019  . BPH (benign prostatic hyperplasia) 07/26/2018  . Biliary colic 55/73/2202  . Sciatica of left side 01/10/2017  . Hyperglycemia 06/16/2016  . DIVERTICULOSIS, COLON 03/18/2009  . History of colonic polyps 03/18/2009  . Hyperlipidemia 01/23/2009  . Sarcoidosis ([place]) 11/26/2007    Current Outpatient Medications on File Prior to Visit  Medication Sig Dispense Refill  . LORazepam (ATIVAN) 0.5 MG tablet Take 1 tablet (0.5 mg total) by mouth at bedtime as needed for sleep. 30 tablet 0  . omeprazole (PRILOSEC) 40 MG capsule Take 1 capsule (40 mg total) by mouth daily. Take 30 minutes prior to a meal 30 capsule 3   No current facility-administered medications on file prior to visit.    Past Medical History:  Diagnosis Date  . Diverticulosis   . Hyperlipidemia   . Keratoconjunctivitis  sicca   . Sarcoidosis 1985   PMH of    Past Surgical History:  Procedure Laterality Date  . APPENDECTOMY    . CHOLECYSTECTOMY N/A 10/20/2017   Procedure: LAPAROSCOPIC CHOLECYSTECTOMY;  Surgeon: Clovis Riley, MD;  Location: [facility];  Service: General;  Laterality: N/A;  . COLONOSCOPY  2015   negative  . COLONOSCOPY W/ POLYPECTOMY  03/2009   [facility]; due 2015  . INGUINAL HERNIA  REPAIR     Bilateral   . Neck Lumpectomy  age 37   benign  . POLYPECTOMY    . trans bronchial biopsy     [place] 80's; sarcoidosis  . WRIST FRACTURE SURGERY Left     Social History   Socioeconomic History  . Marital status: Single    Spouse name: Not on file  . Number of children: Not on file  . Years of education: Not on file  . Highest education level: Not on file  Occupational History  . Not on file  Tobacco Use  . Smoking status: Former Smoker    Quit date: 07/11/1988    Years since quitting: 31.0  . Smokeless tobacco: Never Used  . Tobacco comment: smoked [place] , up to 1 ppWEEK  Substance and Sexual Activity  . Alcohol use: Yes    Alcohol/week: 14.0 - 21.0 standard drinks    Types: 14 - 21 Cans of beer per week    Comment: (2-3 beers per day)  . Drug use: No  . Sexual activity: Not on file  Other Topics Concern  . Not on file  Social History Narrative   Exercise: runs, weights   Social Determinants of Health   Financial Resource Strain:   . Difficulty of Paying Living Expenses: Not on file  Food Insecurity:   . Worried About Estate manager/land agent  of Food in the Last Year: Not on file  . Ran Out of Food in the Last Year: Not on file  Transportation Needs:   . Lack of Transportation (Medical): Not on file  . Lack of Transportation (Non-Medical): Not on file  Physical Activity:   . Days of Exercise per Week: Not on file  . Minutes of Exercise per Session: Not on file  Stress:   . Feeling of Stress : Not on file  Social Connections:   . Frequency of Communication with Friends and Family: Not on file  . Frequency of Social Gatherings with Friends and Family: Not on file  . Attends Religious Services: Not on file  . Active Member of Clubs or Organizations: Not on file  . Attends Banker Meetings: Not on file  . Marital Status: Not on file    Family History  Problem Relation Age of Onset  . HIV Brother   . Colon cancer Brother   . Heart attack  Maternal Grandfather 65  . Heart murmur Mother   . Coronary artery disease Mother        MI @ 67; CBAG @ 60  . Prostate cancer Paternal Grandfather   . Alzheimer's disease Father   . Colonic polyp Father   . Heart attack Other        M uncles X 2; @ 70 & 82  . Fibromyalgia Sister   . Prostate cancer Brother   . Colon cancer Paternal Grandmother   . Diabetes Neg Hx   . Stroke Neg Hx   . Esophageal cancer Neg Hx   . Stomach cancer Neg Hx     Review of Systems  Constitutional: Negative for chills and fever.  Eyes: Negative for visual disturbance.  Respiratory: Negative for cough, shortness of breath and wheezing.   Cardiovascular: Negative for chest pain, palpitations and leg swelling.  Gastrointestinal: Positive for abdominal distention (with stress and certain foods) and diarrhea (occ ). Negative for abdominal pain, blood in stool and nausea.       GERD controlled  Genitourinary: Negative for difficulty urinating, dysuria and hematuria.  Musculoskeletal: Negative for arthralgias and back pain.  Skin: Negative for color change and rash.  Neurological: Negative for light-headedness and headaches.  Psychiatric/Behavioral: Positive for dysphoric mood and sleep disturbance. The patient is nervous/anxious.        Objective:   Vitals:   07/30/19 0852  BP: 124/78  Pulse: 71  Temp: 98.3 F (36.8 C)  SpO2: 98%   Filed Weights   07/30/19 0852  Weight: 186 lb (84.4 kg)   Body mass index is 23.88 kg/m.  BP Readings from Last 3 Encounters:  07/30/19 124/78  07/23/19 106/60  05/21/19 118/78    Wt Readings from Last 3 Encounters:  07/30/19 186 lb (84.4 kg)  07/23/19 189 lb 3.2 oz (85.8 kg)  05/21/19 181 lb (82.1 kg)     Physical Exam Constitutional: He appears well-developed and well-nourished. No distress.  HENT:  Head: Normocephalic and atraumatic.  Right Ear: External ear normal.  Left Ear: External ear normal.  Mouth/Throat: Oropharynx is clear and moist.    Normal ear canals and TM b/l  Eyes: Conjunctivae and EOM are normal.  Neck: Neck supple. No tracheal deviation present. No thyromegaly present.  No carotid bruit  Cardiovascular: Normal rate, regular rhythm, normal heart sounds and intact distal pulses.   No murmur heard. Pulmonary/Chest: Effort normal and breath sounds normal. No respiratory distress. He has no wheezes.  He has no rales.  Abdominal: Soft. He exhibits no distension. There is no tenderness.  Genitourinary: deferred  Musculoskeletal: He exhibits no edema.  Lymphadenopathy:   He has no cervical adenopathy.  Skin: Skin is warm and dry. He is not diaphoretic.  Psychiatric: He has a normal mood and affect. His behavior is normal.    Depression screen [facility] 2/9 07/30/2019 05/11/2018 05/09/2013  Decreased Interest 0 0 0  Down, Depressed, Hopeless 1 0 0  PHQ - 2 Score 1 0 0  Altered sleeping 1 0 -  Tired, decreased energy 0 0 -  Change in appetite 0 0 -  Feeling bad or failure about yourself  0 0 -  Trouble concentrating 0 0 -  Moving slowly or fidgety/restless 0 0 -  Suicidal thoughts 0 0 -  PHQ-9 Score 2 0 -  Difficult doing work/chores Not difficult at all - -       Assessment & Plan:   Physical exam: Screening blood work  ordered Immunizations  Discussed shingrix, others up to date Colonoscopy   Up to date  Eye exams   Not up to date - encouraged to make appt Exercise   Walking, weights Weight  Normal BMI Substance abuse   none  See Problem List for Assessment and Plan of chronic medical problems.   This visit occurred during the SARS-CoV-2 public health emergency.  Safety protocols were in place, including screening questions prior to the visit, additional usage of staff PPE, and extensive cleaning of exam room while observing appropriate contact time as indicated for disinfecting solutions.

## 2019-07-28 NOTE — Patient Instructions (Addendum)
Blood work was ordered.    All other Health Maintenance issues reviewed.   All recommended immunizations and age-appropriate screenings are up-to-date or discussed.  No immunization administered today.   Medications reviewed and updated.  Changes include :   Fluoxetine 10 mg daily.  Update me via mychart on how you are feeling after a few weeks.    Your prescription(s) have been submitted to your pharmacy. Please take as directed and contact our office if you believe you are having problem(s) with the medication(s).    Please followup in 1 year   Health Maintenance, Male Adopting a healthy lifestyle and getting preventive care are important in promoting health and wellness. Ask your health care provider about:  The right schedule for you to have regular tests and exams.  Things you can do on your own to prevent diseases and keep yourself healthy. What should I know about diet, weight, and exercise? Eat a healthy diet   Eat a diet that includes plenty of vegetables, fruits, low-fat dairy products, and lean protein.  Do not eat a lot of foods that are high in solid fats, added sugars, or sodium. Maintain a healthy weight Body mass index (BMI) is a measurement that can be used to identify possible weight problems. It estimates body fat based on height and weight. Your health care provider can help determine your BMI and help you achieve or maintain a healthy weight. Get regular exercise Get regular exercise. This is one of the most important things you can do for your health. Most adults should:  Exercise for at least 150 minutes each week. The exercise should increase your heart rate and make you sweat (moderate-intensity exercise).  Do strengthening exercises at least twice a week. This is in addition to the moderate-intensity exercise.  Spend less time sitting. Even light physical activity can be beneficial. Watch cholesterol and blood lipids Have your blood tested for  lipids and cholesterol at 61 years of age, then have this test every 5 years. You may need to have your cholesterol levels checked more often if:  Your lipid or cholesterol levels are high.  You are older than 61 years of age.  You are at high risk for heart disease. What should I know about cancer screening? Many types of cancers can be detected early and may often be prevented. Depending on your health history and family history, you may need to have cancer screening at various ages. This may include screening for:  Colorectal cancer.  Prostate cancer.  Skin cancer.  Lung cancer. What should I know about heart disease, diabetes, and high blood pressure? Blood pressure and heart disease  High blood pressure causes heart disease and increases the risk of stroke. This is more likely to develop in people who have high blood pressure readings, are of African descent, or are overweight.  Talk with your health care provider about your target blood pressure readings.  Have your blood pressure checked: ? Every 3-5 years if you are 68-44 years of age. ? Every year if you are 32 years old or older.  If you are between the ages of 68 and 48 and are a current or former smoker, ask your health care provider if you should have a one-time screening for abdominal aortic aneurysm (AAA). Diabetes Have regular diabetes screenings. This checks your fasting blood sugar level. Have the screening done:  Once every three years after age 70 if you are at a normal weight and have a low  risk for diabetes.  More often and at a younger age if you are overweight or have a high risk for diabetes. What should I know about preventing infection? Hepatitis B If you have a higher risk for hepatitis B, you should be screened for this virus. Talk with your health care provider to find out if you are at risk for hepatitis B infection. Hepatitis C Blood testing is recommended for:  Everyone born from [birth year] through  1965.  Anyone with known risk factors for hepatitis C. Sexually transmitted infections (STIs)  You should be screened each year for STIs, including gonorrhea and chlamydia, if: ? You are sexually active and are younger than 60 years of age. ? You are older than 61 years of age and your health care provider tells you that you are at risk for this type of infection. ? Your sexual activity has changed since you were last screened, and you are at increased risk for chlamydia or gonorrhea. Ask your health care provider if you are at risk.  Ask your health care provider about whether you are at high risk for HIV. Your health care provider may recommend a prescription medicine to help prevent HIV infection. If you choose to take medicine to prevent HIV, you should first get tested for HIV. You should then be tested every 3 months for as long as you are taking the medicine. Follow these instructions at home: Lifestyle  Do not use any products that contain nicotine or tobacco, such as cigarettes, e-cigarettes, and chewing tobacco. If you need help quitting, ask your health care provider.  Do not use street drugs.  Do not share needles.  Ask your health care provider for help if you need support or information about quitting drugs. Alcohol use  Do not drink alcohol if your health care provider tells you not to drink.  If you drink alcohol: ? Limit how much you have to 0-2 drinks a day. ? Be aware of how much alcohol is in your drink. In the [place] one drink equals one 12 oz bottle of beer (355 mL), one 5 oz glass of wine (148 mL), or one 1 oz glass of hard liquor (44 mL). General instructions  Schedule regular health, dental, and eye exams.  Stay current with your vaccines.  Tell your health care provider if: ? You often feel depressed. ? You have ever been abused or do not feel safe at home. Summary  Adopting a healthy lifestyle and getting preventive care are important in promoting  health and wellness.  Follow your health care provider's instructions about healthy diet, exercising, and getting tested or screened for diseases.  Follow your health care provider's instructions on monitoring your cholesterol and blood pressure. This information is not intended to replace advice given to you by your health care provider. Make sure you discuss any questions you have with your health care provider. Document Revised: 06/20/2018 Document Reviewed: 06/20/2018 Elsevier Patient Education  2020 [employer].

## 2019-07-29 ENCOUNTER — Ambulatory Visit (HOSPITAL_COMMUNITY)
Admission: RE | Admit: 2019-07-29 | Discharge: 2019-07-29 | Disposition: A | Discharge status: Home / Self Care | Payer: 59 | Source: Ambulatory Visit | Attending: Gastroenterology | Admitting: Gastroenterology

## 2019-07-29 ENCOUNTER — Encounter (HOSPITAL_COMMUNITY): Payer: Self-pay

## 2019-07-29 ENCOUNTER — Other Ambulatory Visit: Payer: Self-pay

## 2019-07-29 DIAGNOSIS — R634 Abnormal weight loss: Secondary | ICD-10-CM | POA: Diagnosis present

## 2019-07-29 DIAGNOSIS — R109 Unspecified abdominal pain: Secondary | ICD-10-CM | POA: Diagnosis not present

## 2019-07-29 MED ORDER — IOHEXOL 300 MG/ML  SOLN
100.0000 mL | Freq: Once | INTRAMUSCULAR | Status: AC | PRN
  Administered 2019-07-29: 100 mL via INTRAVENOUS

## 2019-07-29 MED ORDER — SODIUM CHLORIDE (PF) 0.9 % IJ SOLN
INTRAMUSCULAR | Status: AC
  Filled 2019-07-29: qty 50

## 2019-07-30 ENCOUNTER — Encounter: Payer: Self-pay | Admitting: Gastroenterology

## 2019-07-30 ENCOUNTER — Ambulatory Visit (INDEPENDENT_AMBULATORY_CARE_PROVIDER_SITE_OTHER): Payer: 59 | Admitting: Internal Medicine

## 2019-07-30 ENCOUNTER — Encounter: Payer: Self-pay | Admitting: Internal Medicine

## 2019-07-30 VITALS — BP 124/78 | HR 71 | Temp 98.3°F | Ht 74.0 in | Wt 186.0 lb

## 2019-07-30 DIAGNOSIS — K580 Irritable bowel syndrome with diarrhea: Secondary | ICD-10-CM

## 2019-07-30 DIAGNOSIS — F32A Depression, unspecified: Secondary | ICD-10-CM | POA: Insufficient documentation

## 2019-07-30 DIAGNOSIS — Z0000 Encounter for general adult medical examination without abnormal findings: Secondary | ICD-10-CM

## 2019-07-30 DIAGNOSIS — F419 Anxiety disorder, unspecified: Secondary | ICD-10-CM | POA: Diagnosis not present

## 2019-07-30 DIAGNOSIS — K589 Irritable bowel syndrome without diarrhea: Secondary | ICD-10-CM | POA: Insufficient documentation

## 2019-07-30 DIAGNOSIS — F3289 Other specified depressive episodes: Secondary | ICD-10-CM

## 2019-07-30 DIAGNOSIS — N400 Enlarged prostate without lower urinary tract symptoms: Secondary | ICD-10-CM

## 2019-07-30 DIAGNOSIS — F329 Major depressive disorder, single episode, unspecified: Secondary | ICD-10-CM | POA: Insufficient documentation

## 2019-07-30 DIAGNOSIS — K219 Gastro-esophageal reflux disease without esophagitis: Secondary | ICD-10-CM

## 2019-07-30 LAB — COMPREHENSIVE METABOLIC PANEL
ALT: 23 U/L (ref 0–53)
AST: 22 U/L (ref 0–37)
Albumin: 3.8 g/dL (ref 3.5–5.2)
Alkaline Phosphatase: 76 U/L (ref 39–117)
BUN: 15 mg/dL (ref 6–23)
CO2: 28 mEq/L (ref 19–32)
Calcium: 9.1 mg/dL (ref 8.4–10.5)
Chloride: 107 mEq/L (ref 96–112)
Creatinine, Ser: 1.01 mg/dL (ref 0.40–1.50)
GFR: 75.2 mL/min (ref >60.00)
Glucose, Bld: 95 mg/dL (ref 70–99)
Potassium: 4.2 mEq/L (ref 3.5–5.1)
Sodium: 140 mEq/L (ref 135–145)
Total Bilirubin: 0.7 mg/dL (ref 0.2–1.2)
Total Protein: 6.2 g/dL (ref 6.0–8.3)

## 2019-07-30 LAB — LIPID PANEL
Cholesterol: 147 mg/dL (ref 0–200)
HDL: 48.2 mg/dL (ref >39.00)
LDL Cholesterol: 82 mg/dL (ref 0–99)
NonHDL: 98.91
Total CHOL/HDL Ratio: 3
Triglycerides: 85 mg/dL (ref 0.0–149.0)
VLDL: 17 mg/dL (ref 0.0–40.0)

## 2019-07-30 LAB — CBC WITH DIFFERENTIAL/PLATELET
Basophils Absolute: 0 10*3/uL (ref 0.0–0.1)
Basophils Relative: 0.8 % (ref 0.0–3.0)
Eosinophils Absolute: 0.1 10*3/uL (ref 0.0–0.7)
Eosinophils Relative: 1.2 % (ref 0.0–5.0)
HCT: 43.9 % (ref 39.0–52.0)
Hemoglobin: 14.9 g/dL (ref 13.0–17.0)
Lymphocytes Relative: 19.3 % (ref 12.0–46.0)
Lymphs Abs: 0.9 10*3/uL (ref 0.7–4.0)
MCHC: 33.9 g/dL (ref 30.0–36.0)
MCV: 92.8 fl (ref 78.0–100.0)
Monocytes Absolute: 0.4 10*3/uL (ref 0.1–1.0)
Monocytes Relative: 8.1 % (ref 3.0–12.0)
Neutro Abs: 3.2 10*3/uL (ref 1.4–7.7)
Neutrophils Relative %: 70.6 % (ref 43.0–77.0)
Platelets: 268 10*3/uL (ref 150.0–400.0)
RBC: 4.73 Mil/uL (ref 4.22–5.81)
RDW: 13.2 % (ref 11.5–15.5)
WBC: 4.5 10*3/uL (ref 4.0–10.5)

## 2019-07-30 LAB — TSH: TSH: 1.97 u[IU]/mL (ref 0.35–4.50)

## 2019-07-30 MED ORDER — FLUOXETINE HCL 10 MG PO CAPS: 10.0000 mg | ORAL_CAPSULE | Freq: Every day | ORAL | 5 refills | Status: DC

## 2019-07-30 NOTE — Assessment & Plan Note (Signed)
Has some generalized anxiety that is affecting his sleep and his IBS Trazodone not well tolerated for sleep.  Has taken lorazepam 0.25 mg for sleep and it is effective, but would like to avoid long-term use Did not tolerate sertraline well-made him more anxious Will try fluoxetine 10 mg daily-hopefully this will help with some generalized anxiety, sleep, some mild depression and his IBS He will update me via MyChart in the next couple weeks and how he is doing Ideally avoid lorazepam Can use other natural things to help with anxiety/sleep such as CBD oil, melatonin-both have helped a little

## 2019-07-30 NOTE — Assessment & Plan Note (Signed)
Following with Dr. Christella Hartigan EGD last year-esophagitis, candidiasis Taking omeprazole 40 mg daily GERD controlled

## 2019-07-30 NOTE — Assessment & Plan Note (Signed)
New, mild dysthymia Starting fluoxetine 10 mg for anxiety which will also likely help his mild depression He will update me via mychart on how he does with the medication

## 2019-07-30 NOTE — Assessment & Plan Note (Signed)
Has symptoms related to certain foods and anxiety Bloating, increased gas and occasional diarrhea Avoiding the foods We will try fluoxetine to help with generalized anxiety

## 2019-07-30 NOTE — Assessment & Plan Note (Signed)
Seeing urology Controlled w/o medication

## 2019-07-31 ENCOUNTER — Encounter: Payer: Self-pay | Admitting: Internal Medicine

## 2019-07-31 ENCOUNTER — Other Ambulatory Visit: Payer: Self-pay | Admitting: Internal Medicine

## 2019-08-05 ENCOUNTER — Other Ambulatory Visit: Payer: Self-pay

## 2019-08-05 ENCOUNTER — Ambulatory Visit (AMBULATORY_SURGERY_CENTER): Payer: Self-pay | Admitting: *Deleted

## 2019-08-05 VITALS — Temp 98.9°F | Ht 74.0 in | Wt 185.0 lb

## 2019-08-05 DIAGNOSIS — R935 Abnormal findings on diagnostic imaging of other abdominal regions, including retroperitoneum: Secondary | ICD-10-CM

## 2019-08-05 DIAGNOSIS — Z01818 Encounter for other preprocedural examination: Secondary | ICD-10-CM

## 2019-08-05 MED ORDER — NA SULFATE-K SULFATE-MG SULF 17.5-3.13-1.6 GM/177ML PO SOLN: ORAL | 0 refills | Status: DC

## 2019-08-05 NOTE — Progress Notes (Signed)
Patient is here in-person for PV. Patient denies any allergies to eggs or soy. Patient denies any problems with anesthesia/sedation. Patient denies any oxygen use at home. Patient denies taking any diet/weight loss medications or blood thinners. Patient is not being treated for MRSA or C-diff. EMMI education assisgned to the patient for the procedure, this was explained and instructions given to patient. COVID-19 screening test is on 2/2 950 am, the pt is aware. Pt is aware that care partner will wait in the car during procedure; if they feel like they will be too hot or cold to wait in the car; they may wait in the 4 th floor lobby. Patient is aware to bring only one care partner. We want them to wear a mask (we do not have any that we can provide them), practice social distancing, and we will check their temperatures when they get here.  I did remind the patient that their care partner needs to stay in the parking lot the entire time and have a cell phone available, we will call them when the pt is ready for discharge. Patient will wear mask into building.    Suprep $15 off coupon given to the patient.

## 2019-08-06 MED ORDER — LORAZEPAM 0.5 MG PO TABS: 0.5000 mg | ORAL_TABLET | Freq: Every evening | ORAL | 2 refills | Status: AC | PRN

## 2019-08-13 ENCOUNTER — Ambulatory Visit (INDEPENDENT_AMBULATORY_CARE_PROVIDER_SITE_OTHER): Payer: 59

## 2019-08-13 ENCOUNTER — Other Ambulatory Visit: Payer: Self-pay | Admitting: Gastroenterology

## 2019-08-13 DIAGNOSIS — Z1159 Encounter for screening for other viral diseases: Secondary | ICD-10-CM

## 2019-08-14 LAB — SARS CORONAVIRUS 2 (TAT 6-24 HRS): SARS Coronavirus 2: NEGATIVE

## 2019-08-16 ENCOUNTER — Other Ambulatory Visit: Payer: Self-pay

## 2019-08-16 ENCOUNTER — Encounter: Payer: Self-pay | Admitting: Gastroenterology

## 2019-08-16 ENCOUNTER — Ambulatory Visit (AMBULATORY_SURGERY_CENTER): Payer: 59 | Admitting: Gastroenterology

## 2019-08-16 VITALS — BP 101/67 | HR 66 | Temp 97.3°F | Resp 14 | Ht 74.0 in | Wt 185.0 lb

## 2019-08-16 DIAGNOSIS — K5730 Diverticulosis of large intestine without perforation or abscess without bleeding: Secondary | ICD-10-CM

## 2019-08-16 DIAGNOSIS — R935 Abnormal findings on diagnostic imaging of other abdominal regions, including retroperitoneum: Secondary | ICD-10-CM

## 2019-08-16 MED ORDER — SODIUM CHLORIDE 0.9 % IV SOLN: 500.0000 mL | Freq: Once | INTRAVENOUS | Status: DC

## 2019-08-16 NOTE — Op Note (Signed)
[facility] Patient Name: Aaron Cherry Procedure Date: 08/16/2019 11:25 AM MRN: [id number] Endoscopist: Rachael Fee , MD Age: 59 Referring MD:  Date of Birth: Sep 17, 1960 Gender: Male Account #: [id number] Procedure:                Colonoscopy Indications:              Abnormal CT of the GI tract; abnormal cecum, IC                            valve Medicines:                Monitored Anesthesia Care Procedure:                Pre-Anesthesia Assessment:                           - Prior to the procedure, a History and Physical                            was performed, and patient medications and                            allergies were reviewed. The patient's tolerance of                            previous anesthesia was also reviewed. The risks                            and benefits of the procedure and the sedation                            options and risks were discussed with the patient.                            All questions were answered, and informed consent                            was obtained. Prior Anticoagulants: The patient has                            taken no previous anticoagulant or antiplatelet                            agents. ASA Grade Assessment: II - A patient with                            mild systemic disease. After reviewing the risks                            and benefits, the patient was deemed in                            satisfactory condition to undergo the procedure.  After obtaining informed consent, the colonoscope                            was passed under direct vision. Throughout the                            procedure, the patient's blood pressure, pulse, and                            oxygen saturations were monitored continuously. The                            Colonoscope was introduced through the anus and                            advanced to the the terminal ileum. The colonoscopy                         was performed without difficulty. The patient                            tolerated the procedure well. The quality of the                            bowel preparation was good. The terminal ileum,                            ileocecal valve, appendiceal orifice, and rectum                            were photographed. Scope In: 11:28:57 AM Scope Out: 43:15:40 AM Scope Withdrawal Time: 0 hours 12 minutes 20 seconds  Total Procedure Duration: 0 hours 17 minutes 59 seconds  Findings:                 The terminal ileum appeared normal.                           Multiple small and large-mouthed diverticula were                            found in the entire colon.                           The exam was otherwise without abnormality on                            direct and retroflexion views. Complications:            No immediate complications. Estimated blood loss:                            None. Estimated Blood Loss:     Estimated blood loss: none. Impression:               - The examined portion of the ileum was normal.                           -  Diverticulosis in the entire examined colon.                           - The examination was otherwise normal on direct                            and retroflexion views.                           - No polyps or cancers. Recommendation:           - Patient has a contact number available for                            emergencies. The signs and symptoms of potential                            delayed complications were discussed with the                            patient. Return to normal activities tomorrow.                            Written discharge instructions were provided to the                            patient.                           - Resume previous diet.                           - Continue present medications.                           - Repeat colonoscopy in 5 years for screening given                             significant FH of colon cancer (brother). Rachael Fee, MD 08/16/2019 11:49:44 AM This report has been signed electronically.

## 2019-08-16 NOTE — Patient Instructions (Signed)
YOU HAD AN ENDOSCOPIC PROCEDURE TODAY AT THE [facility]:   Refer to the procedure report that was given to you for any specific questions about what was found during the examination.  If the procedure report does not answer your questions, please call your gastroenterologist to clarify.  If you requested that your care partner not be given the details of your procedure findings, then the procedure report has been included in a sealed envelope for you to review at your convenience later.  **Handout given on Diverticulosis**  YOU SHOULD EXPECT: Some feelings of bloating in the abdomen. Passage of more gas than usual.  Walking can help get rid of the air that was put into your GI tract during the procedure and reduce the bloating. If you had a lower endoscopy (such as a colonoscopy or flexible sigmoidoscopy) you may notice spotting of blood in your stool or on the toilet paper. If you underwent a bowel prep for your procedure, you may not have a normal bowel movement for a few days.  Please Note:  You might notice some irritation and congestion in your nose or some drainage.  This is from the oxygen used during your procedure.  There is no need for concern and it should clear up in a day or so.  SYMPTOMS TO REPORT IMMEDIATELY:   Following lower endoscopy (colonoscopy or flexible sigmoidoscopy):  Excessive amounts of blood in the stool  Significant tenderness or worsening of abdominal pains  Swelling of the abdomen that is new, acute  Fever of 100F or higher   For urgent or emergent issues, a gastroenterologist can be reached at any hour by calling [phone].   DIET:  We do recommend a small meal at first, but then you may proceed to your regular diet.  Drink plenty of fluids but you should avoid alcoholic beverages for 24 hours.  ACTIVITY:  You should plan to take it easy for the rest of today and you should NOT DRIVE or use heavy machinery until tomorrow (because of the  sedation medicines used during the test).    FOLLOW UP: Our staff will call the number listed on your records 48-72 hours following your procedure to check on you and address any questions or concerns that you may have regarding the information given to you following your procedure. If we do not reach you, we will leave a message.  We will attempt to reach you two times.  During this call, we will ask if you have developed any symptoms of COVID 19. If you develop any symptoms (ie: fever, flu-like symptoms, shortness of breath, cough etc.) before then, please call [phone].  If you test positive for Covid 19 in the 2 weeks post procedure, please call and report this information to us.    If any biopsies were taken you will be contacted by phone or by letter within the next 1-3 weeks.  Please call us at [phone] if you have not heard about the biopsies in 3 weeks.    SIGNATURES/CONFIDENTIALITY: You and/or your care partner have signed paperwork which will be entered into your electronic medical record.  These signatures attest to the fact that that the information above on your After Visit Summary has been reviewed and is understood.  Full responsibility of the confidentiality of this discharge information lies with you and/or your care-partner. 

## 2019-08-16 NOTE — Progress Notes (Signed)
Temp by LC Vitals by DT   Pt's states no medical or surgical changes since previsit or office visit.  

## 2019-08-16 NOTE — Progress Notes (Signed)
To PACU, VSS. Report to [email or web address] 

## 2019-08-20 ENCOUNTER — Telehealth: Payer: Self-pay | Admitting: *Deleted

## 2019-08-20 NOTE — Telephone Encounter (Signed)
Attempted f/u phone call. No answer. Left message. °

## 2019-08-20 NOTE — Telephone Encounter (Signed)
Left message on f/u call 

## 2019-09-30 ENCOUNTER — Other Ambulatory Visit: Payer: Self-pay | Admitting: Physician Assistant

## 2019-10-03 ENCOUNTER — Ambulatory Visit: Payer: 59 | Attending: Internal Medicine

## 2019-10-03 ENCOUNTER — Ambulatory Visit: Payer: 59

## 2019-10-03 DIAGNOSIS — Z23 Encounter for immunization: Secondary | ICD-10-CM

## 2019-10-03 NOTE — Progress Notes (Signed)
   Covid-19 Vaccination Clinic  Name:  [name]    MRN: [id number] DOB: 11-Sep-1960  10/03/2019  [name] was observed post Covid-19 immunization for 15 minutes without incident. He was provided with Vaccine Information Sheet and instruction to access the V-Safe system.   [name] was instructed to call 911 with any severe reactions post vaccine: Marland Kitchen Difficulty breathing  . Swelling of face and throat  . A fast heartbeat  . A bad rash all over body  . Dizziness and weakness   Immunizations Administered    Name Date Dose VIS Date Route   Pfizer COVID-19 Vaccine 10/03/2019 11:59 AM 0.3 mL 06/21/2019 Intramuscular   Manufacturer: [employer], [employer]   Lot: [id number]   NDC: [address]-8

## 2019-10-28 ENCOUNTER — Ambulatory Visit: Payer: 59

## 2019-12-31 ENCOUNTER — Telehealth: Payer: Self-pay | Admitting: Internal Medicine

## 2019-12-31 NOTE — Telephone Encounter (Signed)
No I'm sorry I do not

## 2019-12-31 NOTE — Telephone Encounter (Signed)
    Patient has moved to [place] Do you have any suggestions for a PCP in that area?

## 2019-12-31 NOTE — Telephone Encounter (Signed)
Spoke with patient.

## 2020-01-30 ENCOUNTER — Other Ambulatory Visit: Payer: Self-pay

## 2020-01-30 ENCOUNTER — Telehealth: Payer: Self-pay | Admitting: Gastroenterology

## 2020-01-30 MED ORDER — OMEPRAZOLE 40 MG PO CPDR: 40.0000 mg | DELAYED_RELEASE_CAPSULE | Freq: Every day | ORAL | 3 refills | Status: AC

## 2020-01-30 NOTE — Telephone Encounter (Signed)
Rx for omeprazole 40mg sent to pharmacy as requested.
